# Patient Record
Sex: Female | Born: 1980 | Race: White | Hispanic: No | Marital: Single | Smoking: Never smoker
Health system: Southern US, Community
[De-identification: ages and names within clinical notes are randomized; demographics above are authoritative.]

## PROBLEM LIST (undated history)

## (undated) ENCOUNTER — Inpatient Hospital Stay (HOSPITAL_COMMUNITY): Payer: Self-pay

## (undated) DIAGNOSIS — Z9882 Breast implant status: Secondary | ICD-10-CM

## (undated) DIAGNOSIS — Z789 Other specified health status: Secondary | ICD-10-CM

## (undated) DIAGNOSIS — R87629 Unspecified abnormal cytological findings in specimens from vagina: Secondary | ICD-10-CM

## (undated) HISTORY — PX: BREAST SURGERY: SHX581

## (undated) HISTORY — PX: WISDOM TOOTH EXTRACTION: SHX21

## (undated) HISTORY — PX: NO PAST SURGERIES: SHX2092

---
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
## 2000-12-18 ENCOUNTER — Emergency Department (HOSPITAL_COMMUNITY): Admission: EM | Admit: 2000-12-18 | Discharge: 2000-12-19 | Payer: Self-pay | Admitting: Emergency Medicine

## 2000-12-19 ENCOUNTER — Encounter: Payer: Self-pay | Admitting: Emergency Medicine

## 2001-05-18 ENCOUNTER — Other Ambulatory Visit: Admission: RE | Admit: 2001-05-18 | Discharge: 2001-05-18 | Payer: Self-pay | Admitting: Obstetrics and Gynecology

## 2002-07-09 ENCOUNTER — Emergency Department (HOSPITAL_COMMUNITY): Admission: EM | Admit: 2002-07-09 | Discharge: 2002-07-09 | Payer: Self-pay | Admitting: Emergency Medicine

## 2002-07-10 ENCOUNTER — Emergency Department (HOSPITAL_COMMUNITY): Admission: EM | Admit: 2002-07-10 | Discharge: 2002-07-10 | Payer: Self-pay | Admitting: Emergency Medicine

## 2002-07-11 ENCOUNTER — Encounter: Payer: Self-pay | Admitting: Emergency Medicine

## 2003-04-28 HISTORY — PX: DILATION AND EVACUATION: SHX1459

## 2003-06-16 ENCOUNTER — Ambulatory Visit (HOSPITAL_COMMUNITY): Admission: RE | Admit: 2003-06-16 | Discharge: 2003-06-16 | Payer: Self-pay | Admitting: Emergency Medicine

## 2003-06-16 ENCOUNTER — Encounter (INDEPENDENT_AMBULATORY_CARE_PROVIDER_SITE_OTHER): Payer: Self-pay | Admitting: *Deleted

## 2003-10-19 ENCOUNTER — Other Ambulatory Visit: Admission: RE | Admit: 2003-10-19 | Discharge: 2003-10-19 | Payer: Self-pay | Admitting: Obstetrics and Gynecology

## 2004-12-26 ENCOUNTER — Other Ambulatory Visit: Admission: RE | Admit: 2004-12-26 | Discharge: 2004-12-26 | Payer: Self-pay | Admitting: Obstetrics and Gynecology

## 2006-03-07 ENCOUNTER — Encounter: Admission: RE | Admit: 2006-03-07 | Discharge: 2006-03-07 | Payer: Self-pay | Admitting: Neurological Surgery

## 2007-01-17 ENCOUNTER — Inpatient Hospital Stay (HOSPITAL_COMMUNITY): Admission: AD | Admit: 2007-01-17 | Discharge: 2007-01-18 | Payer: Self-pay | Admitting: Obstetrics and Gynecology

## 2007-01-18 ENCOUNTER — Inpatient Hospital Stay (HOSPITAL_COMMUNITY): Admission: AD | Admit: 2007-01-18 | Discharge: 2007-01-20 | Payer: Self-pay | Admitting: Obstetrics and Gynecology

## 2008-08-10 ENCOUNTER — Inpatient Hospital Stay (HOSPITAL_COMMUNITY): Admission: AD | Admit: 2008-08-10 | Discharge: 2008-08-12 | Payer: Self-pay | Admitting: Obstetrics and Gynecology

## 2010-05-18 ENCOUNTER — Encounter: Payer: Self-pay | Admitting: Neurological Surgery

## 2010-08-06 LAB — CBC
HCT: 36.3 % (ref 36.0–46.0)
HCT: 38.7 % (ref 36.0–46.0)
Hemoglobin: 12.9 g/dL (ref 12.0–15.0)
Hemoglobin: 13.6 g/dL (ref 12.0–15.0)
MCHC: 35.2 g/dL (ref 30.0–36.0)
MCV: 91.5 fL (ref 78.0–100.0)
MCV: 92.2 fL (ref 78.0–100.0)
RBC: 4.2 MIL/uL (ref 3.87–5.11)
RDW: 13.1 % (ref 11.5–15.5)
WBC: 9.9 10*3/uL (ref 4.0–10.5)

## 2010-08-06 LAB — RPR: RPR Ser Ql: NONREACTIVE

## 2010-08-26 ENCOUNTER — Other Ambulatory Visit: Payer: Self-pay | Admitting: Geriatric Medicine

## 2010-08-26 DIAGNOSIS — R0602 Shortness of breath: Secondary | ICD-10-CM

## 2010-08-28 ENCOUNTER — Other Ambulatory Visit: Payer: Self-pay

## 2010-08-29 ENCOUNTER — Ambulatory Visit
Admission: RE | Admit: 2010-08-29 | Discharge: 2010-08-29 | Disposition: A | Discharge status: Home / Self Care | Payer: Medicaid Other | Source: Ambulatory Visit | Attending: Geriatric Medicine | Admitting: Geriatric Medicine

## 2010-08-29 DIAGNOSIS — R0602 Shortness of breath: Secondary | ICD-10-CM

## 2010-08-29 MED ORDER — IOHEXOL 350 MG/ML SOLN
100.0000 mL | Freq: Once | INTRAVENOUS | Status: AC | PRN
  Administered 2010-08-29: 100 mL via INTRAVENOUS

## 2010-09-12 NOTE — Op Note (Signed)
NAME:  Carolyn Alexander, Carolyn Alexander                           ACCOUNT NO.:  [id number]   MEDICAL RECORD NO.:  [id number]                   PATIENT TYPE:  AMB   LOCATION:  SDC                                  FACILITY:  [facility]   PHYSICIAN:  Michelle L. Vincente Poli, M.D.            DATE OF BIRTH:  09-05-1980   DATE OF PROCEDURE:  06/16/2003  DATE OF DISCHARGE:                                 OPERATIVE REPORT   PREOPERATIVE DIAGNOSIS:  Retained products of conception.   POSTOPERATIVE DIAGNOSIS:  Retained products of conception.   PROCEDURE:  Dilatation and evacuation.   SURGEON:  Michelle L. Vincente Poli, M.D.   ANESTHESIA:  MAC.   INDICATIONS FOR PROCEDURE:  Retained products of conception.   DESCRIPTION OF PROCEDURE:  The patient was taken to the operating room,  where she was given anesthesia, placed in lithotomy position.  The vagina  and vulva were prepped and draped in the usual sterile fashion.  An in-and-  out catheter was used to empty the bladder.  The speculum was inserted into  the vagina and the cervix was grasped with a tenaculum and the uterus was  sounded to 7 cm in the midline.  The cervical internal os was gently dilated  with the [facility] dilators, initially started with a sharp curette, and we  retrieved a moderate amount of tissue consistent with positive.  I then  inserted the #7 suction cannula and did a suction curettage too.  At the end  of that I reinserted the sharp curette and all tissue was noted to be  removed and the uterus was completely cleaned.  There was no vaginal  bleeding noted.  All instruments were removed from the vagina.  All sponge,  lap, and needle counts were correct x2.  The patient tolerated the procedure  well and went to the recovery room in stable condition.                                               Michelle L. Vincente Poli, M.D.    Florestine Avers  D:  06/16/2003  T:  06/16/2003  Job:  [id number]

## 2011-02-05 LAB — CBC
HCT: 35.7 — ABNORMAL LOW
Hemoglobin: 12.6
MCHC: 35.1
MCHC: 35.5
MCV: 90.2
Platelets: 177
Platelets: 189
RDW: 12.1
WBC: 9.9

## 2011-04-28 DIAGNOSIS — Z9882 Breast implant status: Secondary | ICD-10-CM

## 2011-04-28 HISTORY — DX: Breast implant status: Z98.82

## 2011-04-28 NOTE — L&D Delivery Note (Signed)
Operative Delivery Note At 6:13 PM a viable female was delivered via Vaginal, [employer], operational).  Presentation: vertex; Position: Occiput,, Anterior; Station: +3. Pt extremely numb, noted to be straight OA @ + 3, mother  Exhausted, 2 traction efforts to delivery  Verbal consent: obtained from patient.  Risks and benefits discussed in detail.  Risks include, but are not limited to the risks of anesthesia, bleeding, infection, damage to maternal tissues, fetal cephalhematoma.  There is also the risk of inability to effect vaginal delivery of the head, or shoulder dystocia that cannot be resolved by established maneuvers, leading to the need for emergency cesarean section.  APGAR: 8, 9; weight .   Placenta status: Intact, Spontaneous.   Cord: 3 vessels with the following complications: None.  Cord pH: sent  Anesthesia: Epidural  Instruments: KIWI Episiotomy: None Lacerations: None Suture Repair: NA Est. Blood Loss (mL):   Mom to postpartum.  Baby to nursery-stable.  Meriel Pica 04/26/2012, 6:44 PM

## 2011-09-25 LAB — OB RESULTS CONSOLE GC/CHLAMYDIA
Chlamydia: NEGATIVE
Gonorrhea: NEGATIVE

## 2011-09-25 LAB — OB RESULTS CONSOLE ANTIBODY SCREEN: Antibody Screen: NEGATIVE

## 2011-09-25 LAB — OB RESULTS CONSOLE RPR: RPR: NONREACTIVE

## 2012-03-26 LAB — OB RESULTS CONSOLE GBS: GBS: NEGATIVE

## 2012-04-26 ENCOUNTER — Encounter (HOSPITAL_COMMUNITY): Payer: Self-pay | Admitting: Anesthesiology

## 2012-04-26 ENCOUNTER — Encounter (HOSPITAL_COMMUNITY): Payer: Self-pay | Admitting: *Deleted

## 2012-04-26 ENCOUNTER — Inpatient Hospital Stay (HOSPITAL_COMMUNITY)
Admission: AD | Admit: 2012-04-26 | Discharge: 2012-04-27 | DRG: 775 | Disposition: A | Discharge status: Home / Self Care | Payer: Medicaid Other | Source: Ambulatory Visit | Attending: Obstetrics and Gynecology | Admitting: Obstetrics and Gynecology

## 2012-04-26 ENCOUNTER — Inpatient Hospital Stay (HOSPITAL_COMMUNITY): Payer: Medicaid Other | Admitting: Anesthesiology

## 2012-04-26 DIAGNOSIS — O99892 Other specified diseases and conditions complicating childbirth: Secondary | ICD-10-CM | POA: Diagnosis not present

## 2012-04-26 DIAGNOSIS — R209 Unspecified disturbances of skin sensation: Secondary | ICD-10-CM | POA: Diagnosis not present

## 2012-04-26 HISTORY — DX: Other specified health status: Z78.9

## 2012-04-26 LAB — CBC
HCT: 38.8 % (ref 36.0–46.0)
Hemoglobin: 13.6 g/dL (ref 12.0–15.0)
MCV: 87 fL (ref 78.0–100.0)
RBC: 4.46 MIL/uL (ref 3.87–5.11)
WBC: 8.8 10*3/uL (ref 4.0–10.5)

## 2012-04-26 MED ORDER — LIDOCAINE HCL (PF) 1 % IJ SOLN
30.0000 mL | INTRAMUSCULAR | Status: DC | PRN
  Filled 2012-04-26: qty 30

## 2012-04-26 MED ORDER — OXYCODONE-ACETAMINOPHEN 5-325 MG PO TABS
1.0000 | ORAL_TABLET | Freq: Four times a day (QID) | ORAL | Status: DC | PRN
  Administered 2012-04-27 (×3): 1 via ORAL
  Filled 2012-04-26 (×3): qty 1

## 2012-04-26 MED ORDER — ACETAMINOPHEN 325 MG PO TABS: 650.0000 mg | ORAL_TABLET | ORAL | Status: DC | PRN

## 2012-04-26 MED ORDER — DEXTROSE IN LACTATED RINGERS 5 % IV SOLN: INTRAVENOUS | Status: DC

## 2012-04-26 MED ORDER — FENTANYL 2.5 MCG/ML BUPIVACAINE 1/10 % EPIDURAL INFUSION (WH - ANES)
INTRAMUSCULAR | Status: DC | PRN
  Administered 2012-04-26: 14 mL/h via EPIDURAL

## 2012-04-26 MED ORDER — OXYTOCIN 40 UNITS IN LACTATED RINGERS INFUSION - SIMPLE MED
62.5000 mL/h | INTRAVENOUS | Status: DC
  Administered 2012-04-26: 62.5 mL/h via INTRAVENOUS
  Filled 2012-04-26: qty 1000

## 2012-04-26 MED ORDER — FENTANYL 2.5 MCG/ML BUPIVACAINE 1/10 % EPIDURAL INFUSION (WH - ANES)
14.0000 mL/h | INTRAMUSCULAR | Status: DC
  Filled 2012-04-26: qty 125

## 2012-04-26 MED ORDER — LACTATED RINGERS IV SOLN: 500.0000 mL | INTRAVENOUS | Status: DC | PRN

## 2012-04-26 MED ORDER — PRENATAL MULTIVITAMIN CH
1.0000 | ORAL_TABLET | Freq: Every day | ORAL | Status: DC
  Administered 2012-04-27: 1 via ORAL
  Filled 2012-04-26: qty 1

## 2012-04-26 MED ORDER — ONDANSETRON HCL 4 MG/2ML IJ SOLN: 4.0000 mg | Freq: Four times a day (QID) | INTRAMUSCULAR | Status: DC | PRN

## 2012-04-26 MED ORDER — MEASLES, MUMPS & RUBELLA VAC ~~LOC~~ INJ: 0.5000 mL | INJECTION | Freq: Once | SUBCUTANEOUS | Status: DC

## 2012-04-26 MED ORDER — TETANUS-DIPHTH-ACELL PERTUSSIS 5-2.5-18.5 LF-MCG/0.5 IM SUSP: 0.5000 mL | Freq: Once | INTRAMUSCULAR | Status: DC

## 2012-04-26 MED ORDER — OXYTOCIN BOLUS FROM INFUSION: 500.0000 mL | INTRAVENOUS | Status: DC

## 2012-04-26 MED ORDER — EPHEDRINE 5 MG/ML INJ
10.0000 mg | INTRAVENOUS | Status: DC | PRN
  Filled 2012-04-26: qty 4

## 2012-04-26 MED ORDER — BISACODYL 10 MG RE SUPP: 10.0000 mg | Freq: Every day | RECTAL | Status: DC | PRN

## 2012-04-26 MED ORDER — ONDANSETRON HCL 4 MG PO TABS: 4.0000 mg | ORAL_TABLET | ORAL | Status: DC | PRN

## 2012-04-26 MED ORDER — WITCH HAZEL-GLYCERIN EX PADS: 1.0000 "application " | MEDICATED_PAD | CUTANEOUS | Status: DC | PRN

## 2012-04-26 MED ORDER — LACTATED RINGERS IV SOLN
500.0000 mL | Freq: Once | INTRAVENOUS | Status: AC
  Administered 2012-04-26: 500 mL via INTRAVENOUS

## 2012-04-26 MED ORDER — LANOLIN HYDROUS EX OINT: TOPICAL_OINTMENT | CUTANEOUS | Status: DC | PRN

## 2012-04-26 MED ORDER — SIMETHICONE 80 MG PO CHEW: 80.0000 mg | CHEWABLE_TABLET | ORAL | Status: DC | PRN

## 2012-04-26 MED ORDER — DIPHENHYDRAMINE HCL 25 MG PO CAPS: 25.0000 mg | ORAL_CAPSULE | Freq: Four times a day (QID) | ORAL | Status: DC | PRN

## 2012-04-26 MED ORDER — EPHEDRINE 5 MG/ML INJ: 10.0000 mg | INTRAVENOUS | Status: DC | PRN

## 2012-04-26 MED ORDER — LIDOCAINE HCL (PF) 1 % IJ SOLN
INTRAMUSCULAR | Status: DC | PRN
  Administered 2012-04-26: 4 mL
  Administered 2012-04-26: 5 mL

## 2012-04-26 MED ORDER — FLEET ENEMA 7-19 GM/118ML RE ENEM: 1.0000 | ENEMA | Freq: Every day | RECTAL | Status: DC | PRN

## 2012-04-26 MED ORDER — SENNOSIDES-DOCUSATE SODIUM 8.6-50 MG PO TABS: 2.0000 | ORAL_TABLET | Freq: Every day | ORAL | Status: DC

## 2012-04-26 MED ORDER — SODIUM BICARBONATE 8.4 % IV SOLN
INTRAVENOUS | Status: DC | PRN
  Administered 2012-04-26: 4 mL via EPIDURAL

## 2012-04-26 MED ORDER — ONDANSETRON HCL 4 MG/2ML IJ SOLN: 4.0000 mg | INTRAMUSCULAR | Status: DC | PRN

## 2012-04-26 MED ORDER — PHENYLEPHRINE 40 MCG/ML (10ML) SYRINGE FOR IV PUSH (FOR BLOOD PRESSURE SUPPORT)
80.0000 ug | PREFILLED_SYRINGE | INTRAVENOUS | Status: DC | PRN
  Filled 2012-04-26: qty 5

## 2012-04-26 MED ORDER — OXYCODONE-ACETAMINOPHEN 5-325 MG PO TABS: 1.0000 | ORAL_TABLET | ORAL | Status: DC | PRN

## 2012-04-26 MED ORDER — PHENYLEPHRINE 40 MCG/ML (10ML) SYRINGE FOR IV PUSH (FOR BLOOD PRESSURE SUPPORT): 80.0000 ug | PREFILLED_SYRINGE | INTRAVENOUS | Status: DC | PRN

## 2012-04-26 MED ORDER — DIPHENHYDRAMINE HCL 50 MG/ML IJ SOLN: 12.5000 mg | INTRAMUSCULAR | Status: DC | PRN

## 2012-04-26 MED ORDER — IBUPROFEN 600 MG PO TABS: 600.0000 mg | ORAL_TABLET | Freq: Four times a day (QID) | ORAL | Status: DC | PRN

## 2012-04-26 MED ORDER — ZOLPIDEM TARTRATE 5 MG PO TABS: 5.0000 mg | ORAL_TABLET | Freq: Every evening | ORAL | Status: DC | PRN

## 2012-04-26 MED ORDER — BENZOCAINE-MENTHOL 20-0.5 % EX AERO
1.0000 "application " | INHALATION_SPRAY | CUTANEOUS | Status: DC | PRN
  Administered 2012-04-26: 1 via TOPICAL
  Filled 2012-04-26: qty 56

## 2012-04-26 MED ORDER — LACTATED RINGERS IV SOLN
INTRAVENOUS | Status: DC
  Administered 2012-04-26: 125 mL/h via INTRAVENOUS

## 2012-04-26 MED ORDER — DIBUCAINE 1 % RE OINT: 1.0000 "application " | TOPICAL_OINTMENT | RECTAL | Status: DC | PRN

## 2012-04-26 MED ORDER — IBUPROFEN 800 MG PO TABS
800.0000 mg | ORAL_TABLET | Freq: Three times a day (TID) | ORAL | Status: DC | PRN
  Administered 2012-04-26 – 2012-04-27 (×3): 800 mg via ORAL
  Filled 2012-04-26 (×4): qty 1

## 2012-04-26 MED ORDER — CITRIC ACID-SODIUM CITRATE 334-500 MG/5ML PO SOLN: 30.0000 mL | ORAL | Status: DC | PRN

## 2012-04-26 NOTE — H&P (Signed)
Carolyn Alexander  DICTATION # [id number] CSN# [id number]   Meriel Pica, MD 04/26/2012 12:01 PM

## 2012-04-26 NOTE — Anesthesia Procedure Notes (Addendum)
Epidural Patient location during procedure: OB Start time: 04/26/2012 3:04 PM  Staffing Anesthesiologist: Amear Strojny A. Performed by: anesthesiologist   Preanesthetic Checklist Completed: patient identified, site marked, surgical consent, pre-op evaluation, timeout performed, IV checked, risks and benefits discussed and monitors and equipment checked  Epidural Patient position: sitting Prep: site prepped and draped and DuraPrep Patient monitoring: continuous pulse ox and blood pressure Approach: midline Injection technique: LOR air  Needle:  Needle type: Tuohy  Needle gauge: 17 G Needle length: 9 cm and 9 Needle insertion depth: 4 cm Catheter type: closed end flexible Catheter size: 19 Gauge Catheter at skin depth: 9 cm Test dose: negative and Other  Assessment Events: blood not aspirated, injection not painful, no injection resistance, negative IV test and no paresthesia  Additional Notes Patient identified. Risks and benefits discussed including failed block, incomplete  Pain control, post dural puncture headache, nerve damage, paralysis, blood pressure Changes, nausea, vomiting, reactions to medications-both toxic and allergic and post Partum back pain. All questions were answered. Patient expressed understanding and wished to proceed. Sterile technique was used throughout procedure. Epidural site was Dressed with sterile barrier dressing. No paresthesias, signs of intravascular injection Or signs of intrathecal spread were encountered.  Patient was more comfortable after the epidural was dosed. Please see RN's note for documentation of vital signs and FHR which are stable.   Epidural Patient location during procedure: OB Start time: 04/26/2012 3:56 PM  Staffing Anesthesiologist: Zaryiah Barz A. Performed by: anesthesiologist   Preanesthetic Checklist Completed: patient identified, site marked, surgical consent, pre-op evaluation, timeout performed, IV  checked, risks and benefits discussed and monitors and equipment checked  Epidural Patient position: sitting Prep: site prepped and draped and DuraPrep Patient monitoring: continuous pulse ox and blood pressure Approach: midline Injection technique: LOR air  Needle:  Needle type: Tuohy  Needle gauge: 17 G Needle length: 9 cm and 9 Needle insertion depth: 4 cm Catheter type: closed end flexible Catheter size: 19 Gauge Catheter at skin depth: 9 cm Test dose: negative and Other  Assessment Events: blood not aspirated, injection not painful, no injection resistance, negative IV test and no paresthesia  Additional Notes Patient identified. Risks and benefits discussed including failed block, incomplete  Pain control, post dural puncture headache, nerve damage, paralysis, blood pressure Changes, nausea, vomiting, reactions to medications-both toxic and allergic and post Partum back pain. All questions were answered. Patient expressed understanding and wished to proceed. Sterile technique was used throughout procedure. Epidural site was Dressed with sterile barrier dressing. No paresthesias, signs of intravascular injection Or signs of intrathecal spread were encountered.  Patient was more comfortable after the epidural was dosed. Please see RN's note for documentation of vital signs and FHR which are stable.

## 2012-04-26 NOTE — Progress Notes (Signed)
AROM>>clear AF, cx 3/50/vtx/-2, requests epidural

## 2012-04-26 NOTE — Progress Notes (Signed)
Now C/C/zero, very numb, will start pushing soon

## 2012-04-26 NOTE — Anesthesia Preprocedure Evaluation (Signed)

## 2012-04-27 LAB — CBC
Hemoglobin: 12.1 g/dL (ref 12.0–15.0)
RBC: 3.9 MIL/uL (ref 3.87–5.11)
WBC: 10.1 10*3/uL (ref 4.0–10.5)

## 2012-04-27 MED ORDER — IBUPROFEN 800 MG PO TABS: 800.0000 mg | ORAL_TABLET | Freq: Three times a day (TID) | ORAL | Status: DC | PRN

## 2012-04-27 MED ORDER — OXYCODONE-ACETAMINOPHEN 5-325 MG PO TABS: 1.0000 | ORAL_TABLET | Freq: Four times a day (QID) | ORAL | Status: DC | PRN

## 2012-04-27 NOTE — Progress Notes (Signed)
Post Partum Day 1 Subjective: no complaints  Objective: Blood pressure 106/68, pulse 78, temperature 98 F (36.7 C), temperature source Oral, resp. rate 20, height 5\' 7"  (1.702 m), weight 146 lb (66.225 kg), last menstrual period 07/23/2011, SpO2 99.00%, unknown if currently breastfeeding.  Physical Exam:  General: alert Lochia: appropriate Uterine Fundus: firm Incision: healing well DVT Evaluation: No evidence of DVT seen on physical exam.   Basename 04/27/12 0540 04/26/12 1125  HGB 12.1 13.6  HCT 34.3* 38.8    Assessment/Plan: Discharge home   LOS: 1 day   Kacyn Souder M 04/27/2012, 12:00 PM

## 2012-04-27 NOTE — Anesthesia Postprocedure Evaluation (Signed)
Anesthesia Post Note  Patient: Carolyn Alexander  Procedure(s) Performed: * No procedures listed *  Anesthesia type: Epidural  Patient location: Mother/Baby  Post pain: Pain level controlled  Post assessment: Post-op Vital signs reviewed  Last Vitals:  Filed Vitals:   04/27/12 0540  BP: 106/68  Pulse: 78  Temp: 36.7 C  Resp: 20    Post vital signs: Reviewed  Level of consciousness:alert  Complications: No apparent anesthesia complications

## 2012-04-27 NOTE — Discharge Summary (Signed)
Obstetric Discharge Summary Reason for Admission: onset of labor Prenatal Procedures: none Intrapartum Procedures: spontaneous vaginal delivery, VE assist Postpartum Procedures: none Complications-Operative and Postpartum: none Hemoglobin  Date Value Range Status  04/27/2012 12.1  12.0 - 15.0 g/dL Final     HCT  Date Value Range Status  04/27/2012 34.3* 36.0 - 46.0 % Final    Physical Exam:  General: alert Lochia: appropriate Uterine Fundus: firm Incision: healing well DVT Evaluation: No evidence of DVT seen on physical exam.  Discharge Diagnoses: Term Pregnancy-delivered  Discharge Information: Date: 04/27/2012 Activity: pelvic rest Diet: routine Medications: PNV, Ibuprofen and Percocet Condition: stable Instructions: refer to practice specific booklet Discharge to: home Follow-up Information    Follow up with Carolyn Pica, MD. Schedule an appointment as soon as possible for a visit in 6 weeks.   Contact information:   [address] [place] [address] [phone]          Newborn Data: Live born female  Birth Weight: 7 lb 9.9 oz (3455 g) APGAR: 8, 9  Home with mother.  Carolyn Alexander 04/27/2012, 12:01 PM

## 2012-04-28 NOTE — H&P (Signed)
NAMEBENA, KOBEL               ACCOUNT NO.:  [id number]  MEDICAL RECORD NO.:  [id number]  LOCATION:                                 FACILITY:  PHYSICIAN:  Duke Salvia. Marcelle Overlie, M.D.DATE OF BIRTH:  1981-02-19  DATE OF ADMISSION: DATE OF DISCHARGE:                             HISTORY & PHYSICAL   CHIEF COMPLAINT:  Labor at term.  HISTORY OF PRESENT ILLNESS:  A 32 year old, G6, P 2-0-3-2, EDD April 28, 2012, presents to the office at 39-5/7th weeks, 3 cm 75% in early labor, was sent to labor and delivery for admission.  GBS is negative. A 1-hour GTT was normal.  PAST MEDICAL HISTORY:  Please see the [facility] form for all details.  PHYSICAL EXAMINATION:  VITAL SIGNS:  Temp 98.2, blood pressure 120/72. HEENT:  Unremarkable. NECK:  Supple without masses.  LUNGS:  Clear.  CARDIOVASCULAR:  Regular rate and rhythm without murmurs, rubs or gallops noted.  BREASTS:  Not examined. PELVIC:  Term fundal height.  Fetal heart rate 140, cervix is 3, 75% vertex, -2. EXTREMITIES:  Unremarkable. NEUROLOGIC:  Unremarkable.  IMPRESSION:  Term pregnancy, early labor.  PLAN:  We will admit for labor, request  epidural.     Duke Salvia. Marcelle Overlie, M.D.     [facility]  D:  04/26/2012  T:  04/26/2012  Job:  [id number]

## 2012-04-28 NOTE — Progress Notes (Signed)
Post discharge chart review completed.  

## 2012-10-05 ENCOUNTER — Other Ambulatory Visit: Payer: Self-pay

## 2013-02-17 IMAGING — CT CT ANGIO CHEST
3 of 6 series · 11 of 30 positions shown · IV contrast ([ID] OMNI 300)
Comparison: None.

CLINICAL DATA: Shortness of breath for 3.5 weeks, nonsmoker 8
years.

CT ANGIOGRAPHY CHEST WITH CONTRAST
TECHNIQUE: Multidetector CT imaging of the chest was performed
using the standard protocol during bolus administration of
intravenous contrast.  Multiplanar CT image reconstructions
including MIPs were obtained to evaluate the vascular anatomy.
Contrast:  Intravenous 100 ml Amnipaque-300.

[Series 4: pe 1.25 · axial · 0.60mm/px · z∈[-207,+0]mm · 6 of 234 slices shown]
[im 34/234  lung]
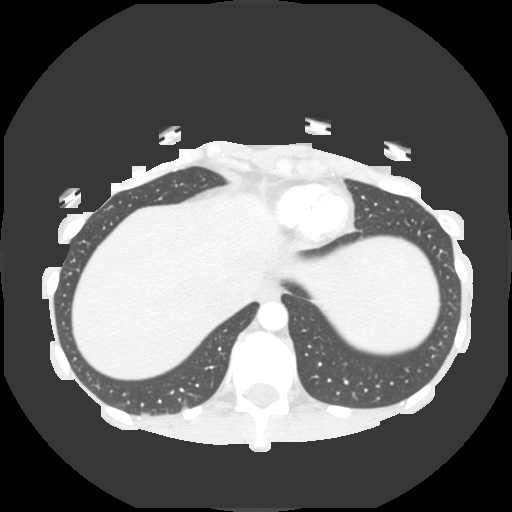
[im 67/234  mediastinal]
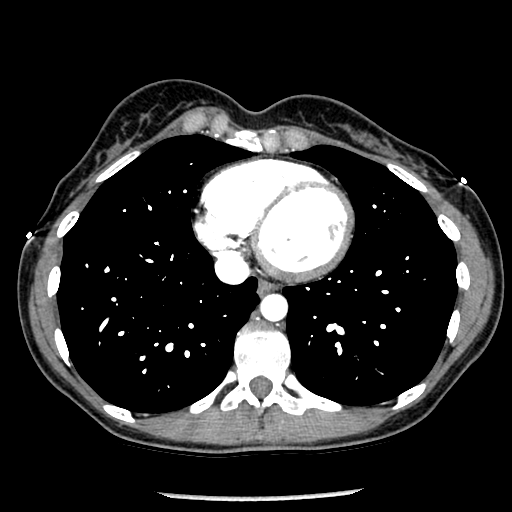
[im 100/234  lung]
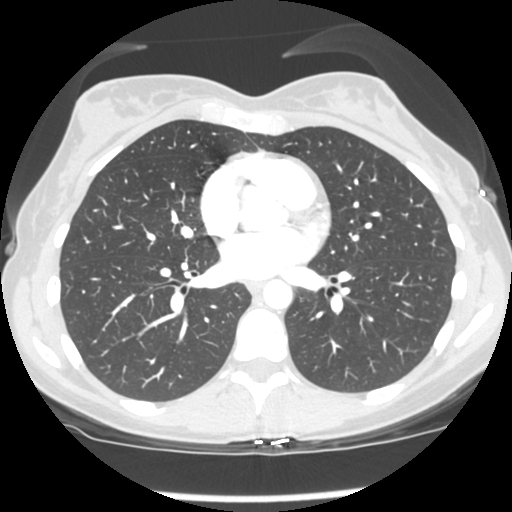
[im 134/234  mediastinal]
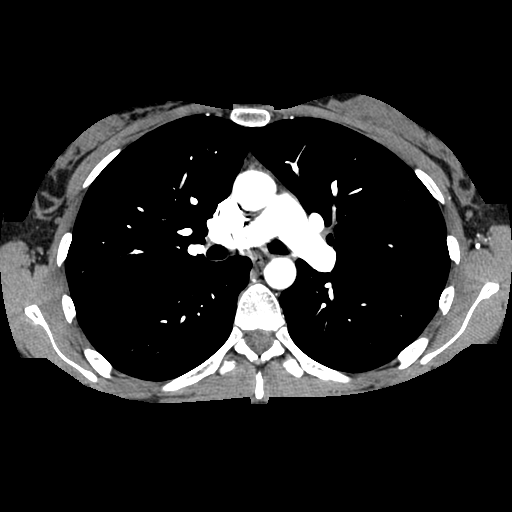
[im 167/234  lung]
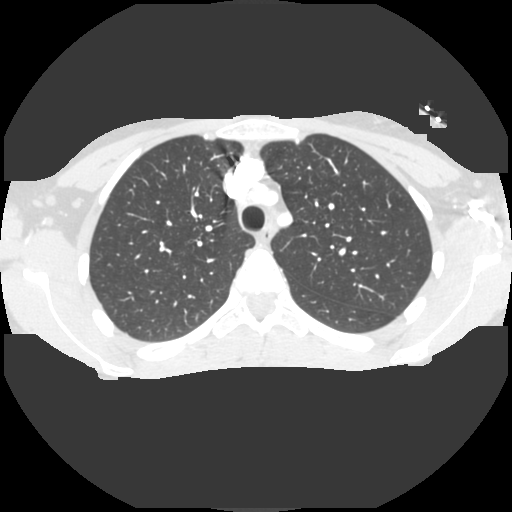
[im 200/234  mediastinal]
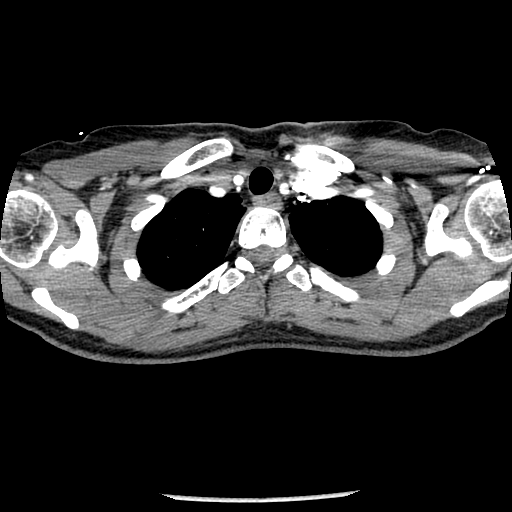

[Series 5: pe 2.5 · axial · 0.60mm/px · z∈[-152,-54]mm · 2 of 117 slices shown]
[im 39/117  lung]
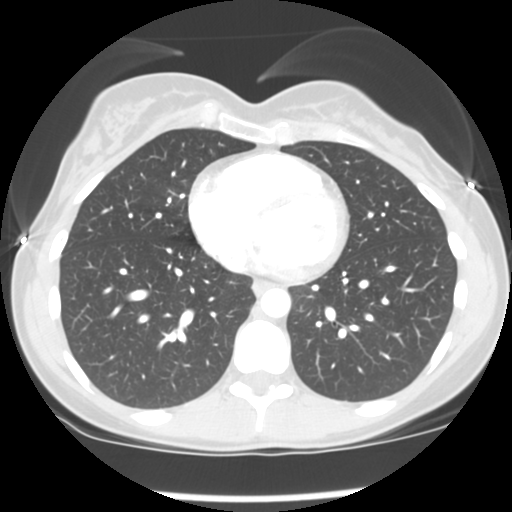
[im 78/117  lung]
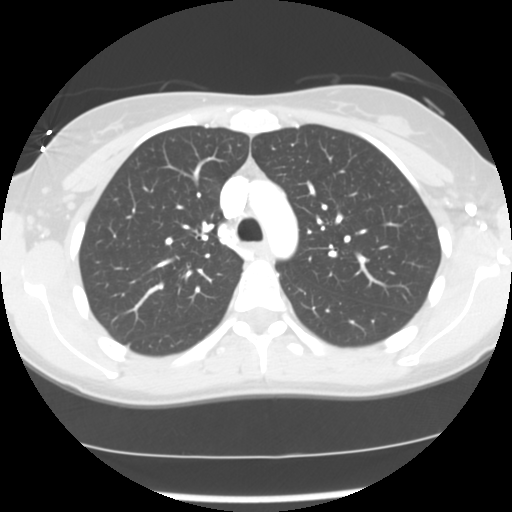

[Series 602: sagittal body · sagittal · 0.60mm/px · 3 of 124 slices shown]
[im 31/124  lung]
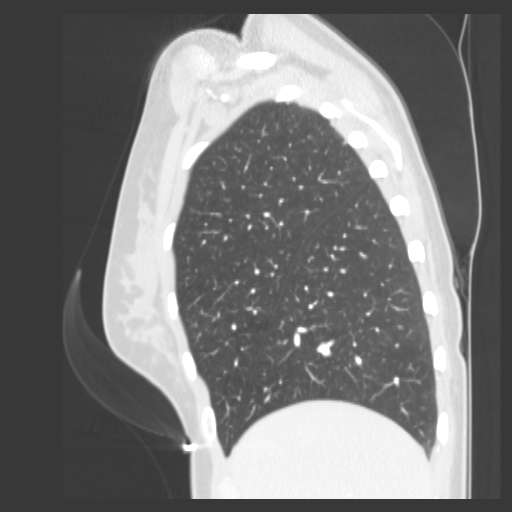
[im 62/124  lung]
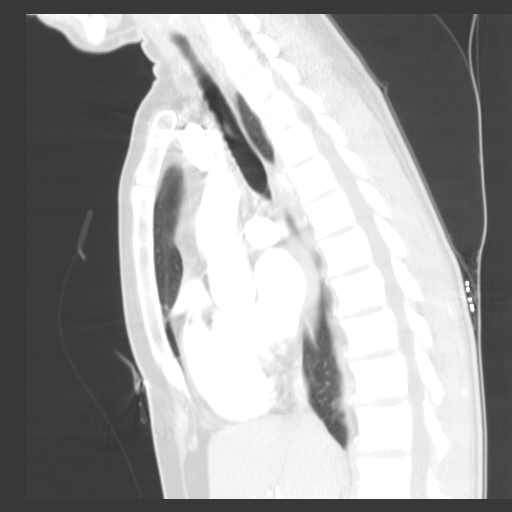
[im 93/124  lung]
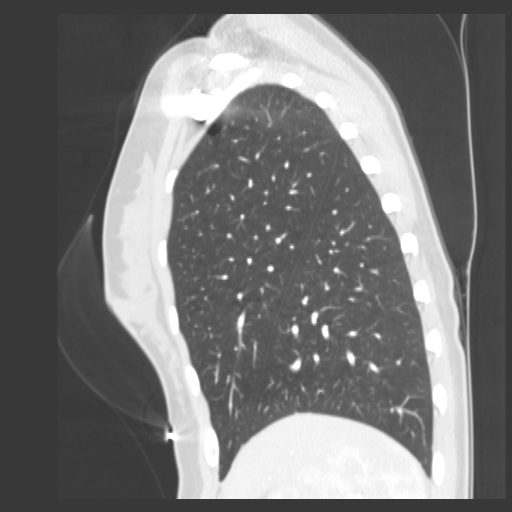

[11 of 30 positions shown; findings below may reference images not displayed]

FINDINGS: No evidence for acute pulmonary embolus.

Lungs are clear.

No mediastinal, hilar, axillary, supraclavicular mass/adenopathy
seen.

Osseous structures appear normal.

Heart size appears normal.

Review of the MIP images confirms the above findings.
IMPRESSION: Normal.

## 2013-03-30 LAB — OB RESULTS CONSOLE HEPATITIS B SURFACE ANTIGEN: HEP B S AG: NEGATIVE

## 2013-03-30 LAB — OB RESULTS CONSOLE RPR: RPR: NONREACTIVE

## 2013-03-30 LAB — OB RESULTS CONSOLE ABO/RH: RH Type: POSITIVE

## 2013-03-30 LAB — OB RESULTS CONSOLE ANTIBODY SCREEN: Antibody Screen: NEGATIVE

## 2013-03-30 LAB — OB RESULTS CONSOLE HIV ANTIBODY (ROUTINE TESTING): HIV: NONREACTIVE

## 2013-03-30 LAB — OB RESULTS CONSOLE RUBELLA ANTIBODY, IGM: RUBELLA: IMMUNE

## 2013-05-20 ENCOUNTER — Encounter (HOSPITAL_COMMUNITY): Payer: Self-pay | Admitting: *Deleted

## 2013-05-20 ENCOUNTER — Inpatient Hospital Stay (HOSPITAL_COMMUNITY)
Admission: AD | Admit: 2013-05-20 | Discharge: 2013-05-21 | Disposition: A | Discharge status: Home / Self Care | Payer: Medicaid Other | Source: Ambulatory Visit | Attending: Obstetrics and Gynecology | Admitting: Obstetrics and Gynecology

## 2013-05-20 DIAGNOSIS — W108XXA Fall (on) (from) other stairs and steps, initial encounter: Secondary | ICD-10-CM | POA: Insufficient documentation

## 2013-05-20 DIAGNOSIS — Y92009 Unspecified place in unspecified non-institutional (private) residence as the place of occurrence of the external cause: Secondary | ICD-10-CM | POA: Insufficient documentation

## 2013-05-20 DIAGNOSIS — O9A212 Injury, poisoning and certain other consequences of external causes complicating pregnancy, second trimester: Secondary | ICD-10-CM

## 2013-05-20 DIAGNOSIS — O30009 Twin pregnancy, unspecified number of placenta and unspecified number of amniotic sacs, unspecified trimester: Secondary | ICD-10-CM | POA: Insufficient documentation

## 2013-05-20 DIAGNOSIS — M545 Low back pain, unspecified: Secondary | ICD-10-CM | POA: Insufficient documentation

## 2013-05-20 DIAGNOSIS — O99891 Other specified diseases and conditions complicating pregnancy: Secondary | ICD-10-CM | POA: Insufficient documentation

## 2013-05-20 DIAGNOSIS — O9989 Other specified diseases and conditions complicating pregnancy, childbirth and the puerperium: Principal | ICD-10-CM

## 2013-05-20 DIAGNOSIS — O30039 Twin pregnancy, monochorionic/diamniotic, unspecified trimester: Secondary | ICD-10-CM | POA: Insufficient documentation

## 2013-05-20 HISTORY — DX: Breast implant status: Z98.82

## 2013-05-20 HISTORY — DX: Unspecified abnormal cytological findings in specimens from vagina: R87.629

## 2013-05-20 NOTE — MAU Provider Note (Signed)
History     CSN: [id number]  Arrival date and time: 05/20/13 2309   None     Chief Complaint  Patient presents with  . Fall   HPI  Pt is a 33 yo G7P3033 at [redacted]w[redacted]d wks pregnancy with mono/di twins (per prenatal records).  Reports falling down this evening around 2000 on her bottom and went down stairs hitting each stair (approximately 10) while carrying [age] year old.  Reports noticing clear liquid on underwear after the fall.  Denies vaginal bleeding or abdominal pain.  Patient reports pain in lower back and left arm after fall.  Declines xray, feels like it "is bruised".     Past Medical History  Diagnosis Date  . No pertinent past medical history     Past Surgical History  Procedure Laterality Date  . No past surgeries      No family history on file.  History  Substance Use Topics  . Smoking status: Not on file  . Smokeless tobacco: Not on file  . Alcohol Use: No    Allergies:  Allergies  Allergen Reactions  . Latex Rash    Prescriptions prior to admission  Medication Sig Dispense Refill  . ibuprofen (ADVIL,MOTRIN) 800 MG tablet Take 1 tablet (800 mg total) by mouth every 8 (eight) hours as needed.  30 tablet  2  . oxyCODONE-acetaminophen (PERCOCET/ROXICET) 5-325 MG per tablet Take 1-2 tablets by mouth every 6 (six) hours as needed.  30 tablet  0    Review of Systems  Genitourinary:       Leaking of fluid  Musculoskeletal: Positive for back pain (lower ).       Left arm pain  Neurological: Negative for tingling, sensory change, focal weakness and loss of consciousness.  All other systems reviewed and are negative.   Physical Exam   Blood pressure 94/64, pulse 97, temperature 98.4 F (36.9 C), resp. rate 20, height 5\' 7"  (1.702 m), weight 59.33 kg (130 lb 12.8 oz).  Physical Exam  Constitutional: She is oriented to person, place, and time. She appears well-developed and well-nourished. No distress.  HENT:  Head: Normocephalic.  Neck: Normal range of  motion. Neck supple.  Cardiovascular: Normal rate, regular rhythm and normal heart sounds.   Respiratory: Effort normal and breath sounds normal.  GI: Soft. There is no tenderness.  Genitourinary: No bleeding around the vagina. Vaginal discharge (creamy white discharge, negative pooling) found.  Musculoskeletal: Normal range of motion. She exhibits tenderness.       Lumbar back: She exhibits tenderness. She exhibits normal range of motion, no edema, no deformity and no laceration.       Back:       Arms: Neurological: She is alert and oriented to person, place, and time.  Skin: Skin is warm and dry.    MAU Course  Procedures 2355 Consulted with Dr. Rana Snare > reviewed HPI/exam>obtain ultrasound and give flexeril and pain meds  Results for orders placed during the hospital encounter of 05/20/13 (from the past 24 hour(s))  POCT FERN TEST     Status: None   Collection Time    05/21/13 12:01 AM      Result Value Range   POCT Fern Test Negative = intact amniotic membranes     Ultrasound: Mono-di twins Twin A FHR 150, subjectively normal amniotic fluid Twin B FHR 152, subjectively normal amniotic fluid Assessment and Plan  33 yo [id number] at [redacted]w[redacted]d wks IUP Traumatic Injury in Pregnancy  Plan: Discharge to home  Provided reassurance Pregnancy precautions Tylenol prn pain with follow-up for no improvement or worsening of arm or back pain.  [facility] HospitalMUHAMMAD,WALIDAH 05/20/2013, 11:37 PM

## 2013-05-20 NOTE — MAU Note (Signed)
I was carrying my [age] yr old and stepped down and slipped. Went down about 10 steps on my bottom and back. When I got up I was wet between my legs. No leaking since. No abd pain. Buttocks, back and Left arm sore

## 2013-05-21 ENCOUNTER — Inpatient Hospital Stay (HOSPITAL_COMMUNITY): Payer: Medicaid Other

## 2013-05-21 DIAGNOSIS — O30009 Twin pregnancy, unspecified number of placenta and unspecified number of amniotic sacs, unspecified trimester: Secondary | ICD-10-CM

## 2013-05-21 LAB — POCT FERN TEST: POCT Fern Test: NEGATIVE

## 2013-05-21 MED ORDER — ACETAMINOPHEN 500 MG PO TABS
500.0000 mg | ORAL_TABLET | Freq: Once | ORAL | Status: AC
  Administered 2013-05-21: 500 mg via ORAL
  Filled 2013-05-21: qty 1

## 2013-06-12 ENCOUNTER — Inpatient Hospital Stay (HOSPITAL_COMMUNITY)
Admission: AD | Admit: 2013-06-12 | Discharge: 2013-06-12 | Disposition: A | Discharge status: Home / Self Care | Payer: Medicaid Other | Source: Ambulatory Visit | Attending: Obstetrics & Gynecology | Admitting: Obstetrics & Gynecology

## 2013-06-12 ENCOUNTER — Inpatient Hospital Stay (HOSPITAL_COMMUNITY): Payer: Medicaid Other

## 2013-06-12 ENCOUNTER — Encounter (HOSPITAL_COMMUNITY): Payer: Self-pay

## 2013-06-12 DIAGNOSIS — O30009 Twin pregnancy, unspecified number of placenta and unspecified number of amniotic sacs, unspecified trimester: Secondary | ICD-10-CM | POA: Insufficient documentation

## 2013-06-12 DIAGNOSIS — N1330 Unspecified hydronephrosis: Secondary | ICD-10-CM

## 2013-06-12 DIAGNOSIS — R109 Unspecified abdominal pain: Secondary | ICD-10-CM | POA: Insufficient documentation

## 2013-06-12 DIAGNOSIS — O9989 Other specified diseases and conditions complicating pregnancy, childbirth and the puerperium: Principal | ICD-10-CM

## 2013-06-12 DIAGNOSIS — M62830 Muscle spasm of back: Secondary | ICD-10-CM

## 2013-06-12 DIAGNOSIS — O99891 Other specified diseases and conditions complicating pregnancy: Secondary | ICD-10-CM | POA: Insufficient documentation

## 2013-06-12 LAB — URINALYSIS, ROUTINE W REFLEX MICROSCOPIC
BILIRUBIN URINE: NEGATIVE
Glucose, UA: NEGATIVE mg/dL
HGB URINE DIPSTICK: NEGATIVE
Ketones, ur: NEGATIVE mg/dL
Leukocytes, UA: NEGATIVE
Nitrite: NEGATIVE
PH: 7.5 (ref 5.0–8.0)
Protein, ur: NEGATIVE mg/dL
SPECIFIC GRAVITY, URINE: 1.01 (ref 1.005–1.030)
Urobilinogen, UA: 0.2 mg/dL (ref 0.0–1.0)

## 2013-06-12 MED ORDER — CYCLOBENZAPRINE HCL 10 MG PO TABS: 10.0000 mg | ORAL_TABLET | Freq: Three times a day (TID) | ORAL | Status: DC | PRN

## 2013-06-12 MED ORDER — OXYCODONE-ACETAMINOPHEN 5-325 MG PO TABS: 1.0000 | ORAL_TABLET | Freq: Four times a day (QID) | ORAL | Status: DC | PRN

## 2013-06-12 MED ORDER — CYCLOBENZAPRINE HCL 10 MG PO TABS
10.0000 mg | ORAL_TABLET | Freq: Once | ORAL | Status: DC
  Filled 2013-06-12: qty 1

## 2013-06-12 NOTE — MAU Note (Signed)
Pt sent from office. C/o right flank pain since this morning. Denies urinary symptoms, fever. Did have some nausea earlier this morning, but denies vomiting. Denies hx of kidney stones.

## 2013-06-12 NOTE — Discharge Instructions (Signed)
Back Pain in Pregnancy °Back pain during pregnancy is common. It happens in about half of all pregnancies. It is important for you and your baby that you remain active during your pregnancy. If you feel that back pain is not allowing you to remain active or sleep well, it is time to see your caregiver. Back pain may be caused by several factors related to changes during your pregnancy. Fortunately, unless you had trouble with your back before your pregnancy, the pain is likely to get better after you deliver. °Low back pain usually occurs between the fifth and seventh months of pregnancy. It can, however, happen in the first couple months. Factors that increase the risk of back problems include:  °· Previous back problems. °· Injury to your back. °· Having twins or multiple births. °· A chronic cough. °· Stress. °· Job-related repetitive motions. °· Muscle or spinal disease in the back. °· Family history of back problems, ruptured (herniated) discs, or osteoporosis. °· Depression, anxiety, and panic attacks. °CAUSES  °· When you are pregnant, your body produces a hormone called relaxin. This hormone makes the ligaments connecting the low back and pubic bones more flexible. This flexibility allows the baby to be delivered more easily. When your ligaments are loose, your muscles need to work harder to support your back. Soreness in your back can come from tired muscles. Soreness can also come from back tissues that are irritated since they are receiving less support. °· As the baby grows, it puts pressure on the nerves and blood vessels in your pelvis. This can cause back pain. °· As the baby grows and gets heavier during pregnancy, the uterus pushes the stomach muscles forward and changes your center of gravity. This makes your back muscles work harder to maintain good posture. °SYMPTOMS  °Lumbar pain during pregnancy °Lumbar pain during pregnancy usually occurs at or above the waist in the center of the back. There  may be pain and numbness that radiates into your leg or foot. This is similar to low back pain experienced by non-pregnant women. It usually increases with sitting for long periods of time, standing, or repetitive lifting. Tenderness may also be present in the muscles along your upper back. °Posterior pelvic pain during pregnancy °Pain in the back of the pelvis is more common than lumbar pain in pregnancy. It is a deep pain felt in your side at the waistline, or across the tailbone (sacrum), or in both places. You may have pain on one or both sides. This pain can also go into the buttocks and backs of the upper thighs. Pubic and groin pain may also be present. The pain does not quickly resolve with rest, and morning stiffness may also be present. °Pelvic pain during pregnancy can be brought on by most activities. A high level of fitness before and during pregnancy may or may not prevent this problem. Labor pain is usually 1 to 2 minutes apart, lasts for about 1 minute, and involves a bearing down feeling or pressure in your pelvis. However, if you are at term with the pregnancy, constant low back pain can be the beginning of early labor, and you should be aware of this. °DIAGNOSIS  °X-rays of the back should not be done during the first 12 to 14 weeks of the pregnancy and only when absolutely necessary during the rest of the pregnancy. MRIs do not give off radiation and are safe during pregnancy. MRIs also should only be done when absolutely necessary. °HOME CARE INSTRUCTIONS °· Exercise   as directed by your caregiver. Exercise is the most effective way to prevent or manage back pain. If you have a back problem, it is especially important to avoid sports that require sudden body movements. Swimming and walking are great activities. °· Do not stand in one place for long periods of time. °· Do not wear high heels. °· Sit in chairs with good posture. Use a pillow on your lower back if necessary. Make sure your head  rests over your shoulders and is not hanging forward. °· Try sleeping on your side, preferably the left side, with a pillow or two between your legs. If you are sore after a night's rest, your bed may be too soft. Try placing a board between your mattress and box spring. °· Listen to your body when lifting. If you are experiencing pain, ask for help or try bending your knees more so you can use your leg muscles rather than your back muscles. Squat down when picking up something from the floor. Do not bend over. °· Eat a healthy diet. Try to gain weight within your caregiver's recommendations. °· Use heat or cold packs 3 to 4 times a day for 15 minutes to help with the pain. °· Only take over-the-counter or prescription medicines for pain, discomfort, or fever as directed by your caregiver. °Sudden (acute) back pain °· Use bed rest for only the most extreme, acute episodes of back pain. Prolonged bed rest over 48 hours will aggravate your condition. °· Ice is very effective for acute conditions. °· Put ice in a plastic bag. °· Place a towel between your skin and the bag. °· Leave the ice on for 10 to 20 minutes every 2 hours, or as needed. °· Using heat packs for 30 minutes prior to activities is also helpful. °Continued back pain °See your caregiver if you have continued problems. Your caregiver can help or refer you for appropriate physical therapy. With conditioning, most back problems can be avoided. Sometimes, a more serious issue may be the cause of back pain. You should be seen right away if new problems seem to be developing. Your caregiver may recommend: °· A maternity girdle. °· An elastic sling. °· A back brace. °· A massage therapist or acupuncture. °SEEK MEDICAL CARE IF:  °· You are not able to do most of your daily activities, even when taking the pain medicine you were given. °· You need a referral to a physical therapist or chiropractor. °· You want to try acupuncture. °SEEK IMMEDIATE MEDICAL CARE  IF: °· You develop numbness, tingling, weakness, or problems with the use of your arms or legs. °· You develop severe back pain that is no longer relieved with medicines. °· You have a sudden change in bowel or bladder control. °· You have increasing pain in other areas of the body. °· You develop shortness of breath, dizziness, or fainting. °· You develop nausea, vomiting, or sweating. °· You have back pain which is similar to labor pains. °· You have back pain along with your water breaking or vaginal bleeding. °· You have back pain or numbness that travels down your leg. °· Your back pain developed after you fell. °· You develop pain on one side of your back. You may have a kidney stone. °· You see blood in your urine. You may have a bladder infection or kidney stone. °· You have back pain with blisters. You may have shingles. °Back pain is fairly common during pregnancy but should not be accepted as just part of   the process. Back pain should always be treated as soon as possible. This will make your pregnancy as pleasant as possible. Document Released: 07/22/2005 Document Revised: 07/06/2011 Document Reviewed: 09/02/2010 [facility] Patient Information 2014 [place], [place]. Kidney Stones Kidney stones (urolithiasis) are deposits that form inside your kidneys. The intense pain is caused by the stone moving through the urinary tract. When the stone moves, the ureter goes into spasm around the stone. The stone is usually passed in the urine.  CAUSES   A disorder that makes certain neck glands produce too much parathyroid hormone (primary hyperparathyroidism).  A buildup of uric acid crystals, similar to gout in your joints.  Narrowing (stricture) of the ureter.  A kidney obstruction present at birth (congenital obstruction).  Previous surgery on the kidney or ureters.  Numerous kidney infections. SYMPTOMS   Feeling sick to your stomach (nauseous).  Throwing up (vomiting).  Blood in the urine  (hematuria).  Pain that usually spreads (radiates) to the groin.  Frequency or urgency of urination. DIAGNOSIS   Taking a history and physical exam.  Blood or urine tests.  CT scan.  Occasionally, an examination of the inside of the urinary bladder (cystoscopy) is performed. TREATMENT   Observation.  Increasing your fluid intake.  Extracorporeal shock wave lithotripsy This is a noninvasive procedure that uses shock waves to break up kidney stones.  Surgery may be needed if you have severe pain or persistent obstruction. There are various surgical procedures. Most of the procedures are performed with the use of small instruments. Only small incisions are needed to accommodate these instruments, so recovery time is minimized. The size, location, and chemical composition are all important variables that will determine the proper choice of action for you. Talk to your health care provider to better understand your situation so that you will minimize the risk of injury to yourself and your kidney.  HOME CARE INSTRUCTIONS   Drink enough water and fluids to keep your urine clear or pale yellow. This will help you to pass the stone or stone fragments.  Strain all urine through the provided strainer. Keep all particulate matter and stones for your health care provider to see. The stone causing the pain may be as small as a grain of salt. It is very important to use the strainer each and every time you pass your urine. The collection of your stone will allow your health care provider to analyze it and verify that a stone has actually passed. The stone analysis will often identify what you can do to reduce the incidence of recurrences.  Only take over-the-counter or prescription medicines for pain, discomfort, or fever as directed by your health care provider.  Make a follow-up appointment with your health care provider as directed.  Get follow-up X-rays if required. The absence of pain does  not always mean that the stone has passed. It may have only stopped moving. If the urine remains completely obstructed, it can cause loss of kidney function or even complete destruction of the kidney. It is your responsibility to make sure X-rays and follow-ups are completed. Ultrasounds of the kidney can show blockages and the status of the kidney. Ultrasounds are not associated with any radiation and can be performed easily in a matter of minutes. SEEK MEDICAL CARE IF:  You experience pain that is progressive and unresponsive to any pain medicine you have been prescribed. SEEK IMMEDIATE MEDICAL CARE IF:   Pain cannot be controlled with the prescribed medicine.  You have a  fever or shaking chills.  The severity or intensity of pain increases over 18 hours and is not relieved by pain medicine.  You develop a new onset of abdominal pain.  You feel faint or pass out.  You are unable to urinate. MAKE SURE YOU:   Understand these instructions.  Will watch your condition.  Will get help right away if you are not doing well or get worse. Document Released: 04/13/2005 Document Revised: 12/14/2012 Document Reviewed: 09/14/2012 [facility] Patient Information 2014 [place], [place].

## 2013-06-12 NOTE — MAU Provider Note (Signed)
History     CSN: [id number]  Arrival date and time: 06/12/13 1607   First Provider Initiated Contact with Patient 06/12/13 1639      Chief Complaint  Patient presents with  . Flank Pain  . Nephrolithiasis   HPI This is a 33 y.o. female with twin pregnancy at [redacted]w[redacted]d who presents with c/o Right flank pain today. Denies dysuria or frequency. + fetal movement of twins.  Denies contractions, leaking or bleeding.   RN Note: Pt sent from office. C/o right flank pain since this morning. Denies urinary symptoms, fever. Did have some nausea earlier this morning, but denies vomiting. Denies hx of kidney stones.        OB History   Grav Para Term Preterm Abortions TAB SAB Ect Mult Living   7 3 3  0 3 2 1  0 0 3      Past Medical History  Diagnosis Date  . No pertinent past medical history   . Vaginal Pap smear, abnormal   . H/O breast augmentation 2013    Past Surgical History  Procedure Laterality Date  . No past surgeries      Family History  Problem Relation Age of Onset  . Cancer Maternal Grandfather     Melanoma    History  Substance Use Topics  . Smoking status: Never Smoker   . Smokeless tobacco: Not on file  . Alcohol Use: No    Allergies:  Allergies  Allergen Reactions  . Latex Rash    Prescriptions prior to admission  Medication Sig Dispense Refill  . Prenatal Vit-Fe Fumarate-FA (MULTIVITAMIN-PRENATAL) 27-0.8 MG TABS tablet Take 1 tablet by mouth daily at 12 noon.      Marland Kitchen oxyCODONE-acetaminophen (PERCOCET/ROXICET) 5-325 MG per tablet Take 1-2 tablets by mouth every 6 (six) hours as needed.  30 tablet  0    Review of Systems  Constitutional: Negative for fever, chills and malaise/fatigue.  Gastrointestinal: Positive for nausea (this morning). Negative for vomiting, abdominal pain, diarrhea and constipation.  Genitourinary: Negative for dysuria and frequency.  Musculoskeletal: Positive for back pain (right flank).  Neurological: Negative for dizziness.    Physical Exam   Blood pressure 112/62, pulse 76, temperature 97.4 F (36.3 C), temperature source Oral, resp. rate 16, height 5\' 7"  (1.702 m), weight 61.236 kg (135 lb), SpO2 100.00%.  Physical Exam  Constitutional: She is oriented to person, place, and time. She appears well-developed and well-nourished. No distress.  HENT:  Head: Normocephalic.  Cardiovascular: Normal rate.   Respiratory: Effort normal.  GI: Soft. She exhibits no distension. There is no tenderness. There is no rebound and no guarding.  Musculoskeletal: Normal range of motion. She exhibits tenderness (right flank mild CVAT).  Neurological: She is alert and oriented to person, place, and time.  Skin: Skin is warm and dry.  Psychiatric: She has a normal mood and affect.   Results for orders placed during the hospital encounter of 06/12/13 (from the past 24 hour(s))  URINALYSIS, ROUTINE W REFLEX MICROSCOPIC     Status: None   Collection Time    06/12/13  4:15 PM      Result Value Ref Range   Color, Urine YELLOW  YELLOW   APPearance CLEAR  CLEAR   Specific Gravity, Urine 1.010  1.005 - 1.030   pH 7.5  5.0 - 8.0   Glucose, UA NEGATIVE  NEGATIVE mg/dL   Hgb urine dipstick NEGATIVE  NEGATIVE   Bilirubin Urine NEGATIVE  NEGATIVE   Ketones, ur NEGATIVE  NEGATIVE mg/dL   Protein, ur NEGATIVE  NEGATIVE mg/dL   Urobilinogen, UA 0.2  0.0 - 1.0 mg/dL   Nitrite NEGATIVE  NEGATIVE   Leukocytes, UA NEGATIVE  NEGATIVE     MAU Course  Procedures  MDM Renal US ordered  [place] Renal  06/12/2013   CLINICAL DATA:  Pregnancy.  Twins.  EXAM: RENAL/URINARY TRACT ULTRASOUND COMPLETE  COMPARISON:  None.  FINDINGS: Right Kidney:  Length: 12.7 cm. Moderate hydronephrosis on the right. Renal echotexture on the right is normal.  Left Kidney:  Length: 11.0 cm. Echogenicity within normal limits. No mass or hydronephrosis visualized.  Bladder:  Appears normal for degree of bladder distention. The patient is pregnant.    IMPRESSION:  Moderate right-sided hydronephrosis.     Electronically Signed   By: Maisie Fushomas  Register   On: 06/12/2013 17:44    Assessment and Plan  A:  Twin IUP at [id number]        Right hydronephrosis, r/o urolithiasis       Cannot rule out muscle spasm as source of pain  P:  Discussed with Dr Langston MaskerMorris       Discharged home       Advised to try dose of Flexeril first. If pain improves, probably spasm       Otherwise, push fluids, strain urine and use Percocet PRN       Check in with office this week for follow up.  [facility] HospitalWILLIAMS,MARIE 06/12/2013, 4:42 PM

## 2013-08-25 ENCOUNTER — Ambulatory Visit (INDEPENDENT_AMBULATORY_CARE_PROVIDER_SITE_OTHER): Payer: Medicaid Other | Admitting: *Deleted

## 2013-08-25 VITALS — BP 117/78 | HR 94

## 2013-08-25 DIAGNOSIS — O30009 Twin pregnancy, unspecified number of placenta and unspecified number of amniotic sacs, unspecified trimester: Secondary | ICD-10-CM

## 2013-08-25 DIAGNOSIS — O30039 Twin pregnancy, monochorionic/diamniotic, unspecified trimester: Secondary | ICD-10-CM

## 2013-08-25 DIAGNOSIS — O30033 Twin pregnancy, monochorionic/diamniotic, third trimester: Secondary | ICD-10-CM

## 2013-09-01 ENCOUNTER — Other Ambulatory Visit: Payer: Medicaid Other

## 2013-09-08 ENCOUNTER — Ambulatory Visit (INDEPENDENT_AMBULATORY_CARE_PROVIDER_SITE_OTHER): Payer: Medicaid Other | Admitting: *Deleted

## 2013-09-08 VITALS — BP 108/64 | HR 77

## 2013-09-08 DIAGNOSIS — O30039 Twin pregnancy, monochorionic/diamniotic, unspecified trimester: Secondary | ICD-10-CM

## 2013-09-08 DIAGNOSIS — O30009 Twin pregnancy, unspecified number of placenta and unspecified number of amniotic sacs, unspecified trimester: Secondary | ICD-10-CM

## 2013-09-08 DIAGNOSIS — O30033 Twin pregnancy, monochorionic/diamniotic, third trimester: Secondary | ICD-10-CM

## 2013-09-08 NOTE — Progress Notes (Signed)
Copy of report and NST tracing sent to Dr. Holland w/pt today.  

## 2013-09-15 ENCOUNTER — Ambulatory Visit (HOSPITAL_COMMUNITY)
Admission: RE | Admit: 2013-09-15 | Discharge: 2013-09-15 | Disposition: A | Discharge status: Home / Self Care | Payer: Medicaid Other | Source: Ambulatory Visit | Attending: Obstetrics & Gynecology | Admitting: Obstetrics & Gynecology

## 2013-09-15 ENCOUNTER — Other Ambulatory Visit (HOSPITAL_COMMUNITY): Payer: Self-pay | Admitting: Obstetrics & Gynecology

## 2013-09-15 ENCOUNTER — Inpatient Hospital Stay (HOSPITAL_COMMUNITY)
Admission: AD | Admit: 2013-09-15 | Discharge: 2013-09-18 | DRG: 775 | Disposition: A | Discharge status: Home / Self Care | Payer: Medicaid Other | Source: Ambulatory Visit | Attending: Obstetrics and Gynecology | Admitting: Obstetrics and Gynecology

## 2013-09-15 ENCOUNTER — Encounter (HOSPITAL_COMMUNITY): Payer: Self-pay | Admitting: *Deleted

## 2013-09-15 ENCOUNTER — Ambulatory Visit (HOSPITAL_COMMUNITY)
Admission: RE | Admit: 2013-09-15 | Discharge: 2013-09-15 | Disposition: A | Discharge status: Home / Self Care | Payer: Medicaid Other | Source: Ambulatory Visit

## 2013-09-15 ENCOUNTER — Ambulatory Visit (INDEPENDENT_AMBULATORY_CARE_PROVIDER_SITE_OTHER): Payer: Medicaid Other | Admitting: *Deleted

## 2013-09-15 VITALS — BP 111/76 | HR 83

## 2013-09-15 DIAGNOSIS — O30039 Twin pregnancy, monochorionic/diamniotic, unspecified trimester: Secondary | ICD-10-CM

## 2013-09-15 DIAGNOSIS — IMO0001 Reserved for inherently not codable concepts without codable children: Secondary | ICD-10-CM

## 2013-09-15 DIAGNOSIS — O30033 Twin pregnancy, monochorionic/diamniotic, third trimester: Secondary | ICD-10-CM

## 2013-09-15 DIAGNOSIS — O30009 Twin pregnancy, unspecified number of placenta and unspecified number of amniotic sacs, unspecified trimester: Principal | ICD-10-CM | POA: Diagnosis present

## 2013-09-15 DIAGNOSIS — IMO0002 Reserved for concepts with insufficient information to code with codable children: Secondary | ICD-10-CM | POA: Diagnosis present

## 2013-09-15 DIAGNOSIS — O365990 Maternal care for other known or suspected poor fetal growth, unspecified trimester, not applicable or unspecified: Secondary | ICD-10-CM | POA: Diagnosis present

## 2013-09-15 LAB — CBC
HCT: 35 % — ABNORMAL LOW (ref 36.0–46.0)
Hemoglobin: 11.9 g/dL — ABNORMAL LOW (ref 12.0–15.0)
MCH: 28.1 pg (ref 26.0–34.0)
MCHC: 34 g/dL (ref 30.0–36.0)
MCV: 82.7 fL (ref 78.0–100.0)
PLATELETS: 189 10*3/uL (ref 150–400)
RBC: 4.23 MIL/uL (ref 3.87–5.11)
RDW: 13.6 % (ref 11.5–15.5)
WBC: 8 10*3/uL (ref 4.0–10.5)

## 2013-09-15 LAB — TYPE AND SCREEN
ABO/RH(D): B POS
Antibody Screen: NEGATIVE

## 2013-09-15 LAB — RPR

## 2013-09-15 MED ORDER — OXYTOCIN 40 UNITS IN LACTATED RINGERS INFUSION - SIMPLE MED
62.5000 mL/h | INTRAVENOUS | Status: DC
  Administered 2013-09-16: 62.5 mL/h via INTRAVENOUS
  Filled 2013-09-15: qty 1000

## 2013-09-15 MED ORDER — OXYCODONE-ACETAMINOPHEN 5-325 MG PO TABS: 1.0000 | ORAL_TABLET | ORAL | Status: DC | PRN

## 2013-09-15 MED ORDER — LACTATED RINGERS IV SOLN
500.0000 mL | Freq: Once | INTRAVENOUS | Status: AC
  Administered 2013-09-16: 500 mL via INTRAVENOUS

## 2013-09-15 MED ORDER — EPHEDRINE 5 MG/ML INJ
10.0000 mg | INTRAVENOUS | Status: DC | PRN
  Administered 2013-09-16: 10 mg via INTRAVENOUS

## 2013-09-15 MED ORDER — EPHEDRINE 5 MG/ML INJ
10.0000 mg | INTRAVENOUS | Status: DC | PRN
  Filled 2013-09-15: qty 4

## 2013-09-15 MED ORDER — ACETAMINOPHEN 325 MG PO TABS: 650.0000 mg | ORAL_TABLET | ORAL | Status: DC | PRN

## 2013-09-15 MED ORDER — LACTATED RINGERS IV SOLN
500.0000 mL | INTRAVENOUS | Status: DC | PRN
  Administered 2013-09-15: 500 mL via INTRAVENOUS

## 2013-09-15 MED ORDER — PHENYLEPHRINE 40 MCG/ML (10ML) SYRINGE FOR IV PUSH (FOR BLOOD PRESSURE SUPPORT)
80.0000 ug | PREFILLED_SYRINGE | INTRAVENOUS | Status: DC | PRN
  Filled 2013-09-15: qty 10

## 2013-09-15 MED ORDER — TERBUTALINE SULFATE 1 MG/ML IJ SOLN: 0.2500 mg | Freq: Once | INTRAMUSCULAR | Status: AC | PRN

## 2013-09-15 MED ORDER — BUTORPHANOL TARTRATE 1 MG/ML IJ SOLN: 1.0000 mg | INTRAMUSCULAR | Status: DC | PRN

## 2013-09-15 MED ORDER — PHENYLEPHRINE 40 MCG/ML (10ML) SYRINGE FOR IV PUSH (FOR BLOOD PRESSURE SUPPORT): 80.0000 ug | PREFILLED_SYRINGE | INTRAVENOUS | Status: DC | PRN

## 2013-09-15 MED ORDER — OXYTOCIN BOLUS FROM INFUSION: 500.0000 mL | INTRAVENOUS | Status: DC

## 2013-09-15 MED ORDER — DIPHENHYDRAMINE HCL 50 MG/ML IJ SOLN: 12.5000 mg | INTRAMUSCULAR | Status: DC | PRN

## 2013-09-15 MED ORDER — ONDANSETRON HCL 4 MG/2ML IJ SOLN: 4.0000 mg | Freq: Four times a day (QID) | INTRAMUSCULAR | Status: DC | PRN

## 2013-09-15 MED ORDER — OXYTOCIN 40 UNITS IN LACTATED RINGERS INFUSION - SIMPLE MED
1.0000 m[IU]/min | INTRAVENOUS | Status: DC
  Administered 2013-09-15: 1 m[IU]/min via INTRAVENOUS

## 2013-09-15 MED ORDER — IBUPROFEN 600 MG PO TABS: 600.0000 mg | ORAL_TABLET | Freq: Four times a day (QID) | ORAL | Status: DC | PRN

## 2013-09-15 MED ORDER — CITRIC ACID-SODIUM CITRATE 334-500 MG/5ML PO SOLN: 30.0000 mL | ORAL | Status: DC | PRN

## 2013-09-15 MED ORDER — HYDROXYZINE HCL 50 MG PO TABS: 50.0000 mg | ORAL_TABLET | Freq: Four times a day (QID) | ORAL | Status: DC | PRN

## 2013-09-15 MED ORDER — FLEET ENEMA 7-19 GM/118ML RE ENEM: 1.0000 | ENEMA | RECTAL | Status: DC | PRN

## 2013-09-15 MED ORDER — FENTANYL 2.5 MCG/ML BUPIVACAINE 1/10 % EPIDURAL INFUSION (WH - ANES)
14.0000 mL/h | INTRAMUSCULAR | Status: DC | PRN
  Administered 2013-09-16: 14 mL/h via EPIDURAL
  Filled 2013-09-15: qty 125

## 2013-09-15 MED ORDER — LIDOCAINE HCL (PF) 1 % IJ SOLN: 30.0000 mL | INTRAMUSCULAR | Status: DC | PRN

## 2013-09-15 MED ORDER — LACTATED RINGERS IV SOLN
INTRAVENOUS | Status: DC
  Administered 2013-09-15 – 2013-09-16 (×3): via INTRAVENOUS

## 2013-09-15 NOTE — Progress Notes (Signed)
MATERNAL FETAL MEDICINE CONSULT  Patient Name: Carolyn Alexander Medical Record Number:  [id number] Date of Birth: 24-Aug-1980 Requesting Physician Name:  Mitchel Honour, DO Date of Service: 09/15/2013  Chief Complaint Monochorionic diamniotic twin pregnancy with suspected growth restriction.  History of Present Illness Carolyn Alexander was seen today secondary to suspected fetal growth restriction in a monochorionic diamniotic twin pregnancy at the request of Mitchel Honour, DO.  The patient is a 33 y.o. [id number],OH [redacted]w[redacted]d with an EDD of 10/19/2013, by Other Basis dating method.  [name] had an ultrasound in her primary OBs office early today that showed fetal growth restriction of twin B.  Growth was normal for both twins and there was an appropriate level of growth discordance on her prior ultrasound 2-3 weeks ago.  She has had no other complications of pregnancy.  She reports adequate fetal movement.  She has no vaginal bleeding, loss of fluid, or contractions.  Review of Systems Pertinent items are noted in HPI.  Patient History OB History  Gravida Para Term Preterm AB SAB TAB Ectopic Multiple Living  7 3 3  0 3 1 2  0 0 3    # Outcome Date GA Lbr Len/2nd Weight Sex Delivery Anes PTL Lv  7 CUR           6 TRM 04/26/12 [redacted]w[redacted]d 03:18 / 01:25 7 lb 9.9 oz (3.455 kg) F VAC EPI  Y  5 TRM 07/2008    M SVD   Y  4 TRM 12/2006    F SVD   Y  3 SAB           2 TAB           1 TAB               Past Medical History  Diagnosis Date  . No pertinent past medical history   . Vaginal Pap smear, abnormal   . H/O breast augmentation 2013    Past Surgical History  Procedure Laterality Date  . No past surgeries      History   Social History  . Marital Status: Single    Spouse Name: N/A    Number of Children: N/A  . Years of Education: N/A   Social History Main Topics  . Smoking status: Never Smoker   . Smokeless tobacco: Not on file  . Alcohol Use: No  . Drug Use: No  . Sexual Activity: Yes    Other Topics Concern  . Not on file   Social History Narrative  . No narrative on file    Family History  Problem Relation Age of Onset  . Cancer Maternal Grandfather     Melanoma   In addition, the patient has no family history of mental retardation, birth defects, or genetic diseases.  Physical Examination Vitals - BP 132/84, Pulse 81, Weight 148 lbs. General appearance - alert, well appearing, and in no distress Abdomen - soft, nontender, nondistended, no masses or organomegaly Extremities - no pedal edema noted  Assessment and Recommendations Monochrorionic diamniotic twin pregnancy with fetal growth restriction of both twins.  [name] was referred to the [facility] due to concern for fetal growth restriction of twin B on an ultrasound performed in her primary OBs office today.  Our ultrasound confirms this finding and also suggests fetal growth restriction of twin A.  Fortunately, the BPP and umbilical artery Dopples were normal for both twin.  Thus, while I recommend delivery at this time it is not neccesary to  proceed immediately with induction of labor or cesarean delivery.  [name] is in agreement with delivery at this time, but would like to return home to get some personal items to bring with her to the hospital.  She strongly desires a vaginal birth, and I think she is an excellent candidate having had two prior term vaginal deliveries and cephalic/cephalic twins with reassuring BPPs.  [name] had several concerns about the risks to her babies with a delivery at 34 weeks.  I discussing them in general terms; however, I think she would benefit from a NICU consultation as she wanted more specific information that I was able to provide.  I spent 30 minutes with [name] today of which 50% was face-to-face counseling.  Thank you for [name] [facility].  Please do not hesitate to contact us with questions.   Rema FendtJoshua Madisen Ludvigsen, MD

## 2013-09-15 NOTE — Progress Notes (Signed)
NICU consult to be done first then start low dose pitocin.

## 2013-09-15 NOTE — Progress Notes (Signed)
NICU team called for neo consult to be done tonight prior to pitocin start.

## 2013-09-15 NOTE — Progress Notes (Signed)
Copy of report and tracing sent to Dr. Langston Masker w/pt today.

## 2013-09-15 NOTE — Consult Note (Signed)
Asked by Dr Henderson Cloud to speak to Carolyn Alexander  to discuss outcome of twin preterm pregnancy. Chart reviewed. She is 35 1/7 weeks, monochorionic, diamniotic twins complicated by IUGR of both babies. EFW of 1961 gms and 1899 gms. BPP and dopplers normal. IOL for IUGR.  I discussed excellent rate of survival at this at this gestation and common morbidities associated. The babies will most likely need admission to NICU if wts are accurate postnatally. Due to EFW, anticipate need for temp support and possible gavage feeding, occasional cases of varying degrees of respiratory distress that need respiratory support. I discussed LOS. She is not planning to breast feed.   I answered her questions to her satisfaction.   Thank you for inviting [place] to be a part of coordination of this mom's care and her babies.  I spent 30 minutes with this consult, more than 50% of the time was with face-to-face counseling with Carolyn Delmar Landau, MD  Neonatologist

## 2013-09-15 NOTE — H&P (Signed)
Carolyn Alexander is a 33 y.o. female presenting for IOL for IUGR of MO/DI twins. U/S today in office suggested IUGR of one twin. U/S with MFM today showed EFW of 4# 3 oz and 4# 5oz. BPP and umbilical dopplers normal x 2. Delivery recommended. Vtx/Vtx on U/X. Maternal Medical History:  Fetal activity: Perceived fetal activity is normal.      OB History   Grav Para Term Preterm Abortions TAB SAB Ect Mult Living   7 3 3  0 3 2 1  0 0 3     Past Medical History  Diagnosis Date  . No pertinent past medical history   . Vaginal Pap smear, abnormal   . H/O breast augmentation 2013   Past Surgical History  Procedure Laterality Date  . No past surgeries    . Wisdom tooth extraction     Family History: family history includes Cancer in her maternal grandfather. Social History:  reports that she has never smoked. She does not have any smokeless tobacco history on file. She reports that she does not drink alcohol or use illicit drugs.   Prenatal Transfer Tool  Maternal Diabetes: No Genetic Screening: Normal Maternal Ultrasounds/Referrals: Normal Fetal Ultrasounds or other Referrals:  None Maternal Substance Abuse:  No Significant Maternal Medications:  None Significant Maternal Lab Results:  None Other Comments:  None  Review of Systems  Eyes: Negative for blurred vision.  Gastrointestinal: Negative for abdominal pain.  Neurological: Negative for headaches.    Dilation: 2 Effacement (%): 50 Station: -2 Exam by:: n licato rn Blood pressure 116/82, pulse 89, temperature 98.2 F (36.8 C), temperature source Oral, resp. rate 16, height 5\' 7"  (1.702 m), weight 67.132 kg (148 lb). Maternal Exam:  Abdomen: Fetal presentation: vertex     Fetal Exam Fetal Monitor Review: Pattern: accelerations present.       Physical Exam  Cardiovascular: Normal rate and regular rhythm.   Respiratory: Effort normal and breath sounds normal.  GI: Soft. There is no tenderness.  Neurological: She  has normal reflexes.    Prenatal labs: ABO, Rh: --/--/B POS (05/22 1930) Antibody: PENDING (05/22 1930) Rubella:   RPR:    HBsAg:    HIV:    GBS:     Assessment/Plan: 33 yo G7P3 at 21 1/7 weeks with IUGR. D/W patient pitocin induction and risks of fetal distress and emergency cesarean section. D/W delivery in operating room and possible cesarean section for second twin. Patient requests PP-BTL-D/W procedure and risks including infection, organ damage, bleeding/transfusion-HIV/Hep, DVT/PE, pneumonia. All questions answered. Neonatology consult placed. D/W patient possible fetal lung immaturity   Roselle Locus II 09/15/2013, 8:58 PM

## 2013-09-16 ENCOUNTER — Inpatient Hospital Stay (HOSPITAL_COMMUNITY): Payer: Medicaid Other | Admitting: Anesthesiology

## 2013-09-16 ENCOUNTER — Encounter (HOSPITAL_COMMUNITY): Payer: Self-pay | Admitting: *Deleted

## 2013-09-16 ENCOUNTER — Encounter (HOSPITAL_COMMUNITY): Payer: Medicaid Other | Admitting: Anesthesiology

## 2013-09-16 LAB — ABO/RH: ABO/RH(D): B POS

## 2013-09-16 MED ORDER — TETANUS-DIPHTH-ACELL PERTUSSIS 5-2.5-18.5 LF-MCG/0.5 IM SUSP
0.5000 mL | Freq: Once | INTRAMUSCULAR | Status: DC
Start: 2013-09-17 — End: 2013-09-18

## 2013-09-16 MED ORDER — ONDANSETRON HCL 4 MG PO TABS: 4.0000 mg | ORAL_TABLET | ORAL | Status: DC | PRN

## 2013-09-16 MED ORDER — BISACODYL 10 MG RE SUPP: 10.0000 mg | Freq: Every day | RECTAL | Status: DC | PRN

## 2013-09-16 MED ORDER — WITCH HAZEL-GLYCERIN EX PADS: 1.0000 "application " | MEDICATED_PAD | CUTANEOUS | Status: DC | PRN

## 2013-09-16 MED ORDER — PRENATAL MULTIVITAMIN CH
1.0000 | ORAL_TABLET | Freq: Every day | ORAL | Status: DC
  Administered 2013-09-18: 1 via ORAL
  Filled 2013-09-16 (×3): qty 1

## 2013-09-16 MED ORDER — DEXTROSE 5 % IV SOLN
5.0000 10*6.[IU] | Freq: Once | INTRAVENOUS | Status: AC
  Administered 2013-09-16: 5 10*6.[IU] via INTRAVENOUS
  Filled 2013-09-16: qty 5

## 2013-09-16 MED ORDER — ONDANSETRON HCL 4 MG/2ML IJ SOLN: 4.0000 mg | INTRAMUSCULAR | Status: DC | PRN

## 2013-09-16 MED ORDER — FLEET ENEMA 7-19 GM/118ML RE ENEM: 1.0000 | ENEMA | Freq: Every day | RECTAL | Status: DC | PRN

## 2013-09-16 MED ORDER — PENICILLIN G POTASSIUM 5000000 UNITS IJ SOLR
2.5000 10*6.[IU] | INTRAVENOUS | Status: DC
  Administered 2013-09-16: 2.5 10*6.[IU] via INTRAVENOUS
  Filled 2013-09-16 (×4): qty 2.5

## 2013-09-16 MED ORDER — BENZOCAINE-MENTHOL 20-0.5 % EX AERO
1.0000 "application " | INHALATION_SPRAY | CUTANEOUS | Status: DC | PRN
  Filled 2013-09-16: qty 56

## 2013-09-16 MED ORDER — FENTANYL 2.5 MCG/ML BUPIVACAINE 1/10 % EPIDURAL INFUSION (WH - ANES): 14.0000 mL/h | INTRAMUSCULAR | Status: DC | PRN

## 2013-09-16 MED ORDER — SIMETHICONE 80 MG PO CHEW: 80.0000 mg | CHEWABLE_TABLET | ORAL | Status: DC | PRN

## 2013-09-16 MED ORDER — IBUPROFEN 600 MG PO TABS
600.0000 mg | ORAL_TABLET | Freq: Four times a day (QID) | ORAL | Status: DC
  Administered 2013-09-16 – 2013-09-18 (×9): 600 mg via ORAL
  Filled 2013-09-16 (×9): qty 1

## 2013-09-16 MED ORDER — DIBUCAINE 1 % RE OINT: 1.0000 "application " | TOPICAL_OINTMENT | RECTAL | Status: DC | PRN

## 2013-09-16 MED ORDER — SENNOSIDES-DOCUSATE SODIUM 8.6-50 MG PO TABS
2.0000 | ORAL_TABLET | ORAL | Status: DC
  Administered 2013-09-17 – 2013-09-18 (×2): 2 via ORAL
  Filled 2013-09-16 (×2): qty 2

## 2013-09-16 MED ORDER — DIPHENHYDRAMINE HCL 25 MG PO CAPS: 25.0000 mg | ORAL_CAPSULE | Freq: Four times a day (QID) | ORAL | Status: DC | PRN

## 2013-09-16 MED ORDER — ZOLPIDEM TARTRATE 5 MG PO TABS: 5.0000 mg | ORAL_TABLET | Freq: Every evening | ORAL | Status: DC | PRN

## 2013-09-16 MED ORDER — OXYCODONE-ACETAMINOPHEN 5-325 MG PO TABS
1.0000 | ORAL_TABLET | ORAL | Status: DC | PRN
  Administered 2013-09-16 – 2013-09-18 (×5): 1 via ORAL
  Filled 2013-09-16 (×5): qty 1

## 2013-09-16 MED ORDER — LANOLIN HYDROUS EX OINT: TOPICAL_OINTMENT | CUTANEOUS | Status: DC | PRN

## 2013-09-16 MED ORDER — LIDOCAINE-EPINEPHRINE (PF) 2 %-1:200000 IJ SOLN
INTRAMUSCULAR | Status: DC | PRN
  Administered 2013-09-16: 4 mL via EPIDURAL

## 2013-09-16 NOTE — Consult Note (Addendum)
[facility]  Delivery Note:  Vaginal Birth        09/16/2013  10:16 AM  I was called to Labor and Delivery at request of the patient's obstetrician (Dr. Henderson Cloud) for SVD of mo-di twins at 44 and 2/[redacted] weeks gestation.  PRENATAL HX:  33 y/o [id number] at 35 and 2/[redacted] weeks gestation with mo-di twin pregnancy.  She was admitted on 5/22 for IOL due to IUGR of twin B.  Her pregnancy has otherwise been uncomplicated.    DELIVERY TWIN A:   Infant was vigorous at delivery, requiring no resuscitation initially other than standard warming, drying and stimulation.  However, infant remained cyanotic and began to develop some mild grunting, so pulse oximeter applied and BBO2 given.  O2 saturations quickly improved from 70's to 90's and grunting also improved.  BBO2 removed at around 15 minutes and infant maintained oxygen saturations in the low 90's.  APGARs 8 and 8.  Exam within normal limits.   Given length of time infant required BBO2 in DR, there is a high likelihood he will need some sort of respiratory support.  Infant admitted to NICU to monitor respiratory status and to monitor for other complications associated with late pretermaturity.  DELIVERY TWIN B:   Infant was vigorous at delivery, requiring no resuscitation initially other than standard warming, drying and stimulation.  However, infant remained cyanotic and began to develop some moderate grunting, so pulse oximiter applied and BBO2 given.  O2 saturations slowly improved from 70's to 90's but he continued to have intermittent grunting and O2 saturations would quickly drop to the 70's when BBO2 removed.  APGARs 8 and 8.  Exam within normal limits.  Infant admitted to NICU for respiratory support and to monitor for other complications associated with late pretermaturity.  ____________________ Electronically Signed By: Maryan Char, MD Neonatology

## 2013-09-16 NOTE — Anesthesia Preprocedure Evaluation (Signed)

## 2013-09-16 NOTE — Progress Notes (Signed)
Delivery Note   Carolyn Alexander, Carolyn Alexander [[id number]]  At 8:30 AM a viable female was delivered via Vaginal, Spontaneous Delivery (Presentation: ; Occiput Anterior).  APGAR: , ; weight .   Placenta status: Intact, Spontaneous Pathology.  Cord: 3 vessels with the following complications: None.  Anesthesia: Epidural  Episiotomy: None Lacerations: None Suture Repair: N/A Est. Blood Loss (mL):     Carolyn Alexander, Carolyn Alexander [[id number]]  At 8:40 AM a viable female was delivered via  (Presentation: ;  ).  APGAR: , ; weight .   Placenta status: , .  Cord:  with the following complications: . SROM with pushing for B, cord prolapse with SROM,rapid delivery in 1 contraction  Anesthesia:   Episiotomy:  Lacerations:  Suture Repair: N/A Est. Blood Loss (mL):    Mom to postpartum.   Baby A to Nursery.   Baby B to Nursery.  Roselle Locus II 09/16/2013, 8:49 AM

## 2013-09-16 NOTE — Progress Notes (Signed)
Epidural just placed FHT reactive x 2 UCs q2-3.5 Cx no change Continue pitocin

## 2013-09-16 NOTE — Plan of Care (Signed)
Problem: Phase II Progression Outcomes Goal: Tolerating diet Outcome: Completed/Met Date Met:  09/16/13 Tolerating Regular diet well.  Problem: Discharge Progression Outcomes Goal: Barriers To Progression Addressed/Resolved Outcome: Completed/Met Date Met:  09/16/13 Epidural line out  Goal: Activity appropriate for discharge plan Outcome: Completed/Met Date Met:  09/16/13 Walks to NICU frequently and tolerates well. Goal: Pain controlled with appropriate interventions Outcome: Completed/Met Date Met:  09/16/13 Good pain control at this time on po Motrin.

## 2013-09-16 NOTE — Anesthesia Postprocedure Evaluation (Signed)
  Anesthesia Post-op Note  Patient: Carolyn Alexander  Procedure(s) Performed: * No procedures listed *  Patient Location: Women's Unit  Anesthesia Type:Epidural  Level of Consciousness: awake  Airway and Oxygen Therapy: Patient Spontanous Breathing  Post-op Pain: mild  Post-op Assessment: Patient's Cardiovascular Status Stable and Respiratory Function Stable  Post-op Vital Signs: stable  Last Vitals:  Filed Vitals:   09/16/13 1540  BP: 109/61  Pulse: 73  Temp: 36.7 C  Resp: 20    Complications: No apparent anesthesia complications

## 2013-09-16 NOTE — Anesthesia Procedure Notes (Signed)
Epidural Patient location during procedure: OB  Preanesthetic Checklist Completed: patient identified, site marked, surgical consent, pre-op evaluation, timeout performed, IV checked, risks and benefits discussed and monitors and equipment checked  Epidural Patient position: sitting Prep: site prepped and draped and DuraPrep Patient monitoring: continuous pulse ox and blood pressure Approach: midline Injection technique: LOR air  Needle:  Needle type: Tuohy  Needle gauge: 17 G Needle length: 9 cm and 9 Needle insertion depth: 4 cm Catheter type: closed end flexible Catheter size: 19 Gauge Catheter at skin depth: 9 cm Test dose: negative  Assessment Events: blood not aspirated, injection not painful, no injection resistance, negative IV test and no paresthesia  Additional Notes Dosing of Epidural:  1st dose, through catheter ............................................Marland Kitchen epi 1:200K + Xylocaine 40 mg  2nd dose, through catheter, after waiting 3 minutes...Marland KitchenMarland Kitchenepi 1:200K + Xylocaine 40 mg   ( 2% Xylo charted as a single dose in Epic Meds for ease of charting; actual dosing was fractionated as above, for saftey's sake)  As each dose occurred, patient was free of IV sx; and patient exhibited no evidence of SA injection.  Patient is more comfortable after epidural dosed. Please see RN's note for documentation of vital signs,and FHR which are stable.  Patient reminded not to try to ambulate with numb legs, and that an RN must be present the 1st time she attempts to get up.

## 2013-09-16 NOTE — Progress Notes (Signed)
Epidural in Cervix 4/70/-2 FHT reactive x 2 UCs q2-3, palpate firm AROM clear

## 2013-09-17 LAB — CBC
HEMATOCRIT: 29.1 % — AB (ref 36.0–46.0)
Hemoglobin: 9.5 g/dL — ABNORMAL LOW (ref 12.0–15.0)
MCH: 27.1 pg (ref 26.0–34.0)
MCHC: 32.6 g/dL (ref 30.0–36.0)
MCV: 83.1 fL (ref 78.0–100.0)
Platelets: 151 10*3/uL (ref 150–400)
RBC: 3.5 MIL/uL — ABNORMAL LOW (ref 3.87–5.11)
RDW: 13.6 % (ref 11.5–15.5)
WBC: 6.9 10*3/uL (ref 4.0–10.5)

## 2013-09-17 NOTE — Progress Notes (Signed)
Post Partum Day 1 Subjective: up ad lib, voiding, tolerating PO, + flatus and tired  Objective: Blood pressure 101/64, pulse 73, temperature 98.1 F (36.7 C), temperature source Oral, resp. rate 18, height 5\' 7"  (1.702 m), weight 67.132 kg (148 lb), SpO2 100.00%, unknown if currently breastfeeding.  Physical Exam:  General: alert, cooperative and no distress Lochia: appropriate Uterine Fundus: firm Incision: healing well DVT Evaluation: No evidence of DVT seen on physical exam.   Recent Labs  09/15/13 1930 09/17/13 0510  HGB 11.9* 9.5*  HCT 35.0* 29.1*    Assessment/Plan: Plan for discharge tomorrow   LOS: 2 days   Roselle Locus II 09/17/2013, 10:23 AM

## 2013-09-17 NOTE — Lactation Note (Signed)
This note was copied from the chart of Carolyn A Heela Laver. Lactation Consultation Note    Follow up consult with this mom of 35 3/7 weeks corrected gestation twins, now 33 hours post partum. I reviewed hand expression with mom, and she expressed about 3 mls. Mom is getting a DEP from her sister. Mom knows to call for questions/concerns.Mom has very sensitive skin, and her nipples are tender from pumping. i gave mom comfort gels and instructed her in their use.  Patient Name: Carolyn Alexander Today's Date: 09/17/2013 Reason for consult: Follow-up assessment;NICU baby;Infant < 6lbs;Late preterm infant;Multiple gestation   Maternal Data Formula Feeding for Exclusion: Yes (babies in NICU) Infant to breast within first hour of birth: No Breastfeeding delayed due to:: Infant status Has patient been taught Hand Expression?: Yes Does the patient have breastfeeding experience prior to this delivery?: Yes  Feeding Feeding Type: Breast Milk with Formula added Length of feed: 15 min  LATCH Score/Interventions                      Lactation Tools Discussed/Used Tools: Pump Breast pump type: Double-Electric Breast Pump WIC Program: Yes Pump Review: Setup, frequency, and cleaning;Milk Storage;Other (comment) (hand expression, ) Initiated by:: bedside rn Date initiated:: 09/16/13   Consult Status Consult Status: Follow-up Date: 09/18/13 Follow-up type: In-patient    Carolyn Alexander 09/17/2013, 6:00 PM    

## 2013-09-18 MED ORDER — IBUPROFEN 600 MG PO TABS: 600.0000 mg | ORAL_TABLET | Freq: Four times a day (QID) | ORAL | Status: DC

## 2013-09-18 MED ORDER — OXYCODONE-ACETAMINOPHEN 5-325 MG PO TABS: 1.0000 | ORAL_TABLET | ORAL | Status: DC | PRN

## 2013-09-18 NOTE — Discharge Summary (Signed)
Physician Discharge Summary  Patient ID: Carolyn Alexander MRN: [id number] DOB/AGE: [birth year] 33 y.o.  Admit date: 09/15/2013 Discharge date: 09/18/2013  Admission Diagnoses:[redacted]w[redacted]d twins/IUGR, for induction  Discharge Diagnoses: same  Active Problems:   IUGR (intrauterine growth restriction)   Discharged Condition: good  Hospital Course: adm for IOL with vtx/vtx twins with IUGR>>progressed to SVD X 2  Consults: None  Significant Diagnostic Studies: labs:  Results for orders placed during the hospital encounter of 09/15/13 (from the past 48 hour(s))  CBC     Status: Abnormal   Collection Time    09/17/13  5:10 AM      Result Value Ref Range   WBC 6.9  4.0 - 10.5 K/uL   RBC 3.50 (*) 3.87 - 5.11 MIL/uL   Hemoglobin 9.5 (*) 12.0 - 15.0 g/dL   Comment: REPEATED TO VERIFY     DELTA CHECK NOTED   HCT 29.1 (*) 36.0 - 46.0 %   MCV 83.1  78.0 - 100.0 fL   MCH 27.1  26.0 - 34.0 pg   MCHC 32.6  30.0 - 36.0 g/dL   RDW 33.2  95.1 - 88.4 %   Platelets 151  150 - 400 K/uL    Treatments: surgery: SVD X 2  Discharge Exam: Blood pressure 108/66, pulse 79, temperature 98.2 F (36.8 C), temperature source Oral, resp. rate 18, height 5\' 7"  (1.702 m), weight 148 lb (67.132 kg), SpO2 100.00%, unknown if currently breastfeeding. fundus firm, nontender  Disposition: 01-Home or Self Care     Medication List    STOP taking these medications       calcium carbonate 500 MG chewable tablet  Commonly known as:  TUMS - dosed in mg elemental calcium      TAKE these medications       ibuprofen 600 MG tablet  Commonly known as:  ADVIL,MOTRIN  Take 1 tablet (600 mg total) by mouth every 6 (six) hours.     oxyCODONE-acetaminophen 5-325 MG per tablet  Commonly known as:  PERCOCET/ROXICET  Take 1-2 tablets by mouth every 4 (four) hours as needed for severe pain (moderate - severe pain).     prenatal multivitamin Tabs tablet  Take 1 tablet by mouth daily at 12 noon.           Follow-up  Information   Follow up with Meriel Pica, MD In 6 weeks. (office will call)    Specialty:  Obstetrics and Gynecology   Contact information:   [address] [place] [address] [phone]       Signed: Meriel Pica 09/18/2013, 8:36 AM

## 2013-09-18 NOTE — Progress Notes (Signed)
Pt discharged to home.  Condition stable.  Pt's significant other taking older children home.  Pt plans to spend additional time in NICU with her babies and her mother will pick her up from there when pt calls her.  Pt ambulated to NICU independently.

## 2013-09-18 NOTE — Progress Notes (Signed)
Ur chart review completed.  

## 2013-09-18 NOTE — Lactation Note (Signed)
This note was copied from the chart of Carolyn A Anniston Hansen. Lactation Consultation Note  Patient Name: Carolyn Alexander Today's Date: 09/18/2013 Reason for consult: Follow-up assessment;NICU baby;Infant < 6lbs;Late preterm infant  Anticipated discharge for mom today.  Mom plans to use sister's Medela DEBP; mom has Medicaid but denied having WIC.  LC discussed with mom Medela's "one-user recommendation" but mom stated she is comfortable with her sister's pump and plans to use it.  Encouraged mom to seek manual about techniques for proper cleaning especially behind face plate of pump.  Mom is pumping 15-20 ml at last pumping session and current session; encouraged hands-on pumping and hand expression with every pumping session.  Mom has already been using standard setting with 8-9 bars.  Reviewed keeping a pumping log in NICU booklet and doing STS and breastfeedings in NICU as much as possible.  Encouraged mom to keep in touch with NICU [facility] and informed her of outpatient appointments if needed after discharge.  Mom was still pumping when LC left room.  Consult Status Consult Status: Complete    Claiborne Rigg [facility] 09/18/2013, 11:48 AM

## 2013-09-22 ENCOUNTER — Other Ambulatory Visit: Payer: Medicaid Other

## 2013-09-29 ENCOUNTER — Ambulatory Visit (HOSPITAL_COMMUNITY): Payer: Medicaid Other

## 2013-09-29 ENCOUNTER — Other Ambulatory Visit: Payer: Medicaid Other

## 2013-10-07 ENCOUNTER — Ambulatory Visit: Admit: 2013-10-07 | Payer: Self-pay | Admitting: Obstetrics and Gynecology

## 2013-10-07 SURGERY — LIGATION, FALLOPIAN TUBE, POSTPARTUM
Anesthesia: Choice | Laterality: Bilateral

## 2013-10-23 NOTE — H&P (Signed)
Cindra PresumeKristen Capriotti  DICTATION # [id number] CSN# [id number]   Meriel PicaHOLLAND,Barth Trella M, MD 10/23/2013 9:28 AM

## 2013-10-24 NOTE — H&P (Signed)
NAMCindra Alexander:  Cory, Susette               ACCOUNT NO.:  [id number]  MEDICAL RECORD NO.:  [id number]  LOCATION:                                 FACILITY:  PHYSICIAN:  Duke Salviaichard M. Marcelle OverlieHolland, M.D.DATE OF BIRTH:  Mar 19, 1981  DATE OF ADMISSION: DATE OF DISCHARGE:                             HISTORY & PHYSICAL   CHIEF COMPLAINT:  Request permanent sterilization.  HISTORY OF PRESENT ILLNESS:  A 33 year old, G7, P-4-0-3-5, who recently delivered twins vaginally presents now for permanent sterilization.  She understands that this is a permanent procedure, we reviewed specific risks related to bleeding, infection, where complications may require open additional surgery, the permanence of procedure, and failure rated 2 to 06/998.  She is firm in her decision to proceed.  PAST MEDICAL HISTORY:  ALLERGIES:  LATEX.  CURRENT MEDICATIONS:  None.  PRIOR SURGERY:  All vaginal deliveries.  Breast augmentation.  Prior D and E.  REVIEW OF SYSTEMS:  Significant for anxiety.  FAMILY HISTORY:  Significant for maternal grandfather with melanoma, otherwise negative.  SOCIAL HISTORY:  Denies alcohol, tobacco, or drug use.  PHYSICAL EXAMINATION:  VITAL SIGNS:  Temp 98.2, blood pressure 120/72. HEENT:  Unremarkable. NECK:  Supple without masses. LUNGS:  Clear. CARDIOVASCULAR:  Regular rate and rhythm without murmurs, rubs, gallops noted.  BREASTS:  Implants.  No other masses noted. ABDOMEN:  Soft, flat, nontender. PELVIC:  Vulva, vagina and cervix normal.  Uterus mid position, normal size.  Adnexa negative. EXTREMITIES:  Unremarkable. NEUROLOGIC:  Unremarkable.  IMPRESSION:  Request permanent sterilization.  PLAN:  Laparoscopic tubal ligation with Filshie clips.  Procedure and risks discussed as above.     Richard M. Marcelle OverlieHolland, M.D.     RMH/MEDQ  D:  10/23/2013  T:  10/23/2013  Job:  [id number]

## 2013-11-08 ENCOUNTER — Encounter (HOSPITAL_COMMUNITY): Payer: Self-pay | Admitting: Pharmacy Technician

## 2013-11-08 ENCOUNTER — Encounter (HOSPITAL_COMMUNITY): Payer: Self-pay | Admitting: *Deleted

## 2013-11-15 ENCOUNTER — Ambulatory Visit (HOSPITAL_COMMUNITY)
Admission: RE | Admit: 2013-11-15 | Discharge: 2013-11-15 | Disposition: A | Discharge status: Home / Self Care | Payer: Medicaid Other | Source: Ambulatory Visit | Attending: Obstetrics and Gynecology | Admitting: Obstetrics and Gynecology

## 2013-11-15 ENCOUNTER — Encounter (HOSPITAL_COMMUNITY)
Admission: RE | Disposition: A | Discharge status: Home / Self Care | Payer: Self-pay | Source: Ambulatory Visit | Attending: Obstetrics and Gynecology

## 2013-11-15 ENCOUNTER — Ambulatory Visit (HOSPITAL_COMMUNITY): Payer: Medicaid Other | Admitting: Anesthesiology

## 2013-11-15 ENCOUNTER — Encounter (HOSPITAL_COMMUNITY): Payer: Medicaid Other | Admitting: Anesthesiology

## 2013-11-15 ENCOUNTER — Encounter (HOSPITAL_COMMUNITY): Payer: Self-pay | Admitting: Anesthesiology

## 2013-11-15 DIAGNOSIS — Z302 Encounter for sterilization: Secondary | ICD-10-CM | POA: Diagnosis not present

## 2013-11-15 HISTORY — PX: LAPAROSCOPIC TUBAL LIGATION: SHX1937

## 2013-11-15 LAB — CBC
HEMATOCRIT: 38.4 % (ref 36.0–46.0)
Hemoglobin: 12.4 g/dL (ref 12.0–15.0)
MCH: 25.9 pg — AB (ref 26.0–34.0)
MCHC: 32.3 g/dL (ref 30.0–36.0)
MCV: 80.3 fL (ref 78.0–100.0)
PLATELETS: 300 10*3/uL (ref 150–400)
RBC: 4.78 MIL/uL (ref 3.87–5.11)
RDW: 14.7 % (ref 11.5–15.5)
WBC: 4.8 10*3/uL (ref 4.0–10.5)

## 2013-11-15 LAB — PREGNANCY, URINE: Preg Test, Ur: NEGATIVE

## 2013-11-15 SURGERY — LIGATION, FALLOPIAN TUBE, LAPAROSCOPIC
Anesthesia: General | Site: Abdomen | Laterality: Bilateral

## 2013-11-15 MED ORDER — DEXAMETHASONE SODIUM PHOSPHATE 10 MG/ML IJ SOLN
INTRAMUSCULAR | Status: DC | PRN
  Administered 2013-11-15: 5 mg via INTRAVENOUS

## 2013-11-15 MED ORDER — FENTANYL CITRATE 0.05 MG/ML IJ SOLN
INTRAMUSCULAR | Status: DC | PRN
  Administered 2013-11-15: 100 ug via INTRAVENOUS

## 2013-11-15 MED ORDER — LIDOCAINE HCL (CARDIAC) 20 MG/ML IV SOLN
INTRAVENOUS | Status: AC
  Filled 2013-11-15: qty 5

## 2013-11-15 MED ORDER — ONDANSETRON HCL 4 MG/2ML IJ SOLN
INTRAMUSCULAR | Status: DC | PRN
Start: 2013-11-15 — End: 2013-11-15
  Administered 2013-11-15: 4 mg via INTRAVENOUS

## 2013-11-15 MED ORDER — ROCURONIUM BROMIDE 100 MG/10ML IV SOLN
INTRAVENOUS | Status: AC
  Filled 2013-11-15: qty 1

## 2013-11-15 MED ORDER — KETOROLAC TROMETHAMINE 30 MG/ML IJ SOLN
INTRAMUSCULAR | Status: AC
  Filled 2013-11-15: qty 1

## 2013-11-15 MED ORDER — GLYCOPYRROLATE 0.2 MG/ML IJ SOLN
INTRAMUSCULAR | Status: AC
  Filled 2013-11-15: qty 3

## 2013-11-15 MED ORDER — FENTANYL CITRATE 0.05 MG/ML IJ SOLN
INTRAMUSCULAR | Status: AC
  Filled 2013-11-15: qty 5

## 2013-11-15 MED ORDER — OXYCODONE-ACETAMINOPHEN 5-325 MG PO TABS
1.0000 | ORAL_TABLET | ORAL | Status: DC | PRN
  Administered 2013-11-15: 1 via ORAL

## 2013-11-15 MED ORDER — LACTATED RINGERS IV SOLN
INTRAVENOUS | Status: DC
Start: 2013-11-15 — End: 2013-11-15
  Administered 2013-11-15 (×3): via INTRAVENOUS

## 2013-11-15 MED ORDER — MIDAZOLAM HCL 2 MG/2ML IJ SOLN
INTRAMUSCULAR | Status: DC | PRN
  Administered 2013-11-15 (×2): 1 mg via INTRAVENOUS

## 2013-11-15 MED ORDER — MIDAZOLAM HCL 2 MG/2ML IJ SOLN
INTRAMUSCULAR | Status: AC
  Filled 2013-11-15: qty 2

## 2013-11-15 MED ORDER — GLYCOPYRROLATE 0.2 MG/ML IJ SOLN
INTRAMUSCULAR | Status: AC
  Filled 2013-11-15: qty 1

## 2013-11-15 MED ORDER — KETOROLAC TROMETHAMINE 30 MG/ML IJ SOLN: 15.0000 mg | Freq: Once | INTRAMUSCULAR | Status: DC | PRN

## 2013-11-15 MED ORDER — NEOSTIGMINE METHYLSULFATE 10 MG/10ML IV SOLN
INTRAVENOUS | Status: AC
  Filled 2013-11-15: qty 1

## 2013-11-15 MED ORDER — OXYCODONE-ACETAMINOPHEN 5-325 MG PO TABS
ORAL_TABLET | ORAL | Status: AC
  Filled 2013-11-15: qty 1

## 2013-11-15 MED ORDER — ONDANSETRON HCL 4 MG/2ML IJ SOLN
INTRAMUSCULAR | Status: AC
  Filled 2013-11-15: qty 2

## 2013-11-15 MED ORDER — MEPERIDINE HCL 25 MG/ML IJ SOLN: 6.2500 mg | INTRAMUSCULAR | Status: DC | PRN

## 2013-11-15 MED ORDER — BUPIVACAINE HCL (PF) 0.25 % IJ SOLN
INTRAMUSCULAR | Status: AC
  Filled 2013-11-15: qty 20

## 2013-11-15 MED ORDER — SCOPOLAMINE 1 MG/3DAYS TD PT72
1.0000 | MEDICATED_PATCH | Freq: Once | TRANSDERMAL | Status: DC
  Administered 2013-11-15: 1.5 mg via TRANSDERMAL

## 2013-11-15 MED ORDER — FENTANYL CITRATE 0.05 MG/ML IJ SOLN
25.0000 ug | INTRAMUSCULAR | Status: DC | PRN
  Administered 2013-11-15: 50 ug via INTRAVENOUS

## 2013-11-15 MED ORDER — GLYCOPYRROLATE 0.2 MG/ML IJ SOLN
INTRAMUSCULAR | Status: DC | PRN
  Administered 2013-11-15: 0.6 mg via INTRAVENOUS
  Administered 2013-11-15: 0.2 mg via INTRAVENOUS

## 2013-11-15 MED ORDER — NEOSTIGMINE METHYLSULFATE 10 MG/10ML IV SOLN
INTRAVENOUS | Status: DC | PRN
  Administered 2013-11-15: 3 mg via INTRAVENOUS

## 2013-11-15 MED ORDER — LIDOCAINE HCL (CARDIAC) 20 MG/ML IV SOLN
INTRAVENOUS | Status: DC | PRN
  Administered 2013-11-15: 80 mg via INTRAVENOUS

## 2013-11-15 MED ORDER — ROCURONIUM BROMIDE 100 MG/10ML IV SOLN
INTRAVENOUS | Status: DC | PRN
  Administered 2013-11-15: 40 mg via INTRAVENOUS

## 2013-11-15 MED ORDER — DEXAMETHASONE SODIUM PHOSPHATE 10 MG/ML IJ SOLN
INTRAMUSCULAR | Status: AC
  Filled 2013-11-15: qty 1

## 2013-11-15 MED ORDER — KETOROLAC TROMETHAMINE 30 MG/ML IJ SOLN
INTRAMUSCULAR | Status: DC | PRN
  Administered 2013-11-15: 30 mg via INTRAMUSCULAR

## 2013-11-15 MED ORDER — PROPOFOL 10 MG/ML IV BOLUS
INTRAVENOUS | Status: DC | PRN
  Administered 2013-11-15: 150 mg via INTRAVENOUS

## 2013-11-15 MED ORDER — SCOPOLAMINE 1 MG/3DAYS TD PT72
MEDICATED_PATCH | TRANSDERMAL | Status: AC
  Administered 2013-11-15: 1.5 mg via TRANSDERMAL
  Filled 2013-11-15: qty 1

## 2013-11-15 MED ORDER — BUPIVACAINE HCL (PF) 0.25 % IJ SOLN
INTRAMUSCULAR | Status: DC | PRN
  Administered 2013-11-15: 10 mL

## 2013-11-15 MED ORDER — HYDROCODONE-IBUPROFEN 7.5-200 MG PO TABS: 1.0000 | ORAL_TABLET | Freq: Three times a day (TID) | ORAL | Status: AC | PRN

## 2013-11-15 MED ORDER — METOCLOPRAMIDE HCL 5 MG/ML IJ SOLN: 10.0000 mg | Freq: Once | INTRAMUSCULAR | Status: DC | PRN

## 2013-11-15 MED ORDER — PROPOFOL 10 MG/ML IV EMUL
INTRAVENOUS | Status: AC
  Filled 2013-11-15: qty 20

## 2013-11-15 MED ORDER — FENTANYL CITRATE 0.05 MG/ML IJ SOLN
INTRAMUSCULAR | Status: AC
  Administered 2013-11-15: 50 ug via INTRAVENOUS
  Filled 2013-11-15: qty 2

## 2013-11-15 MED ORDER — FENTANYL CITRATE 0.05 MG/ML IJ SOLN
INTRAMUSCULAR | Status: AC
  Filled 2013-11-15: qty 2

## 2013-11-15 SURGICAL SUPPLY — 21 items
CATH ROBINSON RED A/P 16FR (CATHETERS) ×3 IMPLANT
CLIP FILSHIE TUBAL LIGA STRL (Clip) ×3 IMPLANT
CLOSURE WOUND 1/4 X3 (GAUZE/BANDAGES/DRESSINGS) ×1
CLOTH BEACON ORANGE TIMEOUT ST (SAFETY) ×3 IMPLANT
DERMABOND ADVANCED (GAUZE/BANDAGES/DRESSINGS) ×2
DERMABOND ADVANCED .7 DNX12 (GAUZE/BANDAGES/DRESSINGS) ×1 IMPLANT
DRESSING OPSITE X SMALL 2X3 (GAUZE/BANDAGES/DRESSINGS) ×3 IMPLANT
DRSG COVADERM PLUS 2X2 (GAUZE/BANDAGES/DRESSINGS) IMPLANT
DRSG OPSITE POSTOP 3X4 (GAUZE/BANDAGES/DRESSINGS) ×3 IMPLANT
GLOVE BIO SURGEON STRL SZ7 (GLOVE) ×3 IMPLANT
GOWN STRL REUS W/TWL LRG LVL3 (GOWN DISPOSABLE) ×6 IMPLANT
NEEDLE INSUFFLATION 120MM (ENDOMECHANICALS) ×3 IMPLANT
PACK LAPAROSCOPY BASIN (CUSTOM PROCEDURE TRAY) ×3 IMPLANT
STRIP CLOSURE SKIN 1/4X3 (GAUZE/BANDAGES/DRESSINGS) ×2 IMPLANT
SUT VIC AB 2-0 UR6 27 (SUTURE) IMPLANT
SUT VICRYL RAPIDE 4/0 PS 2 (SUTURE) ×3 IMPLANT
SYR 20CC LL (SYRINGE) ×3 IMPLANT
TOWEL OR 17X24 6PK STRL BLUE (TOWEL DISPOSABLE) ×6 IMPLANT
TROCAR XCEL DIL TIP R 11M (ENDOMECHANICALS) ×3 IMPLANT
WARMER LAPAROSCOPE (MISCELLANEOUS) ×3 IMPLANT
WATER STERILE IRR 1000ML POUR (IV SOLUTION) ×3 IMPLANT

## 2013-11-15 NOTE — Op Note (Signed)
Preoperative diagnosis: Request permanent sterilization  Postoperative diagnosis: Same  Procedure: Lap scopic tubal by Filshie clip application  Surgeon: Marcelle OverlieHolland  Anesthesia: Gen.  Complications: None  EBL: Less than 10 cc  Procedure and findings  The patient was taken the operating room after an adequate level of general anesthesia was obtained with the patient's legs in stirrups, the abdomen perineum and vagina were prepped and draped in the usual fashion, the bladder was drained, EUA carried out, uterus midposition, normal size, adnexa negative. Appropriate timeout for taken at that point. Hulka tenaculum was positioned. Attention directed to the abdomen where the subumbilical area was infiltrated with quarter percent Marcaine plain small incision was made in the varies needle was introduced without difficulty. Its intra-abdominal position was verified by pressure water testing. After 2-1/2 L pneumoperitoneum syncopated, lap scopic trocar and sleeve were then introduced that difficulty. There was no evidence of any bleeding or trauma. Pelvic findings were inspected carefully noted to be normal quarter percent Marcaine approximately 5 pieces cc was dripped across each tube from the cornu to the fimbriated end Filshie clip applicator was in position starting on the right the tube was positively identified, traced to the fimbriated end, was then regrasped at 2-3 cm from the cornu and a Filshie clip applied a right ankle completely engulfing the right tube with excellent application. The exact same repeated on the left these areas were photo documented. Upper abdomen likewise was unremarkable instruments removed, gas allowed to escape, the umbilical defect closed with a 4-0 Vicryl subcuticular suture and dressing. She tolerated this well went to recovery room in good condition.  Dictated with [facility]  Henritta Mutz M. Milana ObeyHolland M.D.

## 2013-11-15 NOTE — Discharge Instructions (Signed)
DISCHARGE INSTRUCTIONS: Laparoscopy  MAY TAKE IBUPROFEN (MOTRIN, ADVIL) OR ALEVE AFTER 7:30 PM FOR CRAMPS!!!  The following instructions have been prepared to help you care for yourself upon your return home today.  Wound care:  Do not get the incision wet for the first 24 hours. The incision should be kept clean and dry.  The Band-Aids or dressings may be removed the day after surgery.  Should the incision become sore, red, and swollen after the first week, check with your doctor.  Personal hygiene:  Shower the day after your procedure.  Activity and limitations:  Do NOT drive or operate any equipment today.  Do NOT lift anything more than 15 pounds for 2-3 weeks after surgery.  Do NOT rest in bed all day.  Walking is encouraged. Walk each day, starting slowly with 5-minute walks 3 or 4 times a day. Slowly increase the length of your walks.  Walk up and down stairs slowly.  Do NOT do strenuous activities, such as golfing, playing tennis, bowling, running, biking, weight lifting, gardening, mowing, or vacuuming for 2-4 weeks. Ask your doctor when it is okay to start.  Diet: Eat a light meal as desired this evening. You may resume your usual diet tomorrow.  Return to work: This is dependent on the type of work you do. For the most part you can return to a desk job within a week of surgery. If you are more active at work, please discuss this with your doctor.  What to expect after your surgery: You may have a slight burning sensation when you urinate on the first day. You may have a very small amount of blood in the urine. Expect to have a small amount of vaginal discharge/light bleeding for 1-2 weeks. It is not unusual to have abdominal soreness and bruising for up to 2 weeks. You may be tired and need more rest for about 1 week. You may experience shoulder pain for 24-72 hours. Lying flat in bed may relieve it.  Call your doctor for any of the following:  Develop a fever of  100.4 or greater  Inability to urinate 6 hours after discharge from hospital  Severe pain not relieved by pain medications  Persistent of heavy bleeding at incision site  Redness or swelling around incision site after a week  Increasing nausea or vomiting  Patient Signature________________________________________ Nurse Signature_________________________________________

## 2013-11-15 NOTE — Progress Notes (Signed)
The patient was re-examined with no change in status 

## 2013-11-15 NOTE — Anesthesia Preprocedure Evaluation (Addendum)
Anesthesia Evaluation  Patient identified by MRN, date of birth, ID band Patient awake    Reviewed: Allergy & Precautions, H&P , NPO status , Patient's Chart, lab work & pertinent test results, reviewed documented beta blocker date and time   History of Anesthesia Complications (+) PONV  Airway Mallampati: II TM Distance: >3 FB Neck ROM: full    Dental  (+) Teeth Intact   Pulmonary neg pulmonary ROS,  breath sounds clear to auscultation  Pulmonary exam normal       Cardiovascular negative cardio ROS  Rhythm:regular Rate:Normal     Neuro/Psych negative neurological ROS  negative psych ROS   GI/Hepatic negative GI ROS, Neg liver ROS,   Endo/Other  negative endocrine ROS  Renal/GU negative Renal ROS  negative genitourinary   Musculoskeletal   Abdominal   Peds  Hematology negative hematology ROS (+)   Anesthesia Other Findings   Reproductive/Obstetrics negative OB ROS                          Anesthesia Physical Anesthesia Plan  ASA: I  Anesthesia Plan: General ETT   Post-op Pain Management:    Induction:   Airway Management Planned:   Additional Equipment:   Intra-op Plan:   Post-operative Plan:   Informed Consent: I have reviewed the patients History and Physical, chart, labs and discussed the procedure including the risks, benefits and alternatives for the proposed anesthesia with the patient or authorized representative who has indicated his/her understanding and acceptance.   Dental Advisory Given  Plan Discussed with: CRNA and Surgeon  Anesthesia Plan Comments:         Anesthesia Quick Evaluation

## 2013-11-15 NOTE — Anesthesia Procedure Notes (Signed)
Procedures

## 2013-11-15 NOTE — Transfer of Care (Signed)
Immediate Anesthesia Transfer of Care Note  Patient: Carolyn Alexander  Procedure(s) Performed: Procedure(s): LAPAROSCOPIC TUBAL LIGATION with Filshie Clips (Bilateral)  Patient Location: PACU  Anesthesia Type:General  Level of Consciousness: awake, alert , oriented and patient cooperative  Airway & Oxygen Therapy: Patient Spontanous Breathing and Patient connected to nasal cannula oxygen  Post-op Assessment: Report given to PACU RN and Post -op Vital signs reviewed and stable  Post vital signs: Reviewed and stable  Complications: No apparent anesthesia complications

## 2013-11-16 ENCOUNTER — Encounter (HOSPITAL_COMMUNITY): Payer: Self-pay | Admitting: Obstetrics and Gynecology

## 2013-11-17 NOTE — Anesthesia Postprocedure Evaluation (Signed)
  Anesthesia Post-op Note  Patient: Carolyn Alexander  Procedure(s) Performed: Procedure(s): LAPAROSCOPIC TUBAL LIGATION with Filshie Clips (Bilateral) Patient is awake and responsive. Pain and nausea are reasonably well controlled. Vital signs are stable and clinically acceptable. Oxygen saturation is clinically acceptable. There are no apparent anesthetic complications at this time. Patient is ready for discharge.

## 2014-02-26 ENCOUNTER — Encounter (HOSPITAL_COMMUNITY): Payer: Self-pay | Admitting: Obstetrics and Gynecology

## 2014-11-28 ENCOUNTER — Other Ambulatory Visit: Payer: Self-pay | Admitting: Obstetrics and Gynecology

## 2014-11-29 LAB — CYTOLOGY - PAP

## 2015-12-02 IMAGING — US US RENAL
1 series · 14 of 25 positions shown · non-contrast
Comparison: None.

CLINICAL DATA: Pregnancy.  Twins.

EXAM:
RENAL/URINARY TRACT ULTRASOUND COMPLETE

[Series 1: us renal · 14 of 47 slices shown]
[im 1/47]
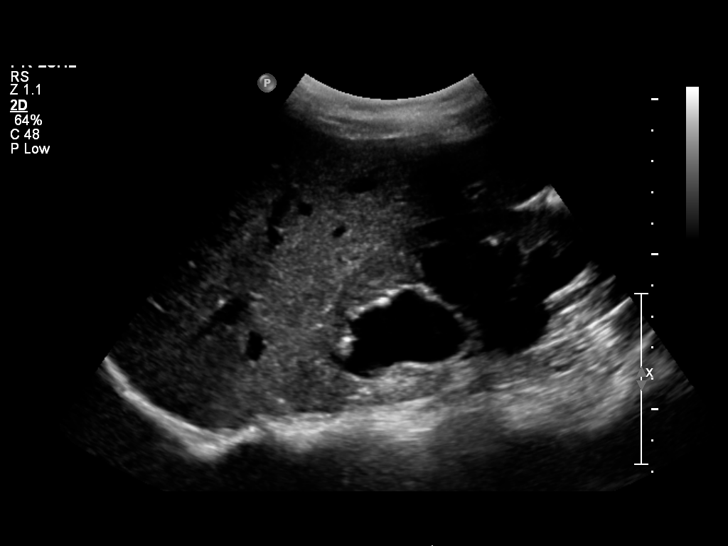
[im 4/47]
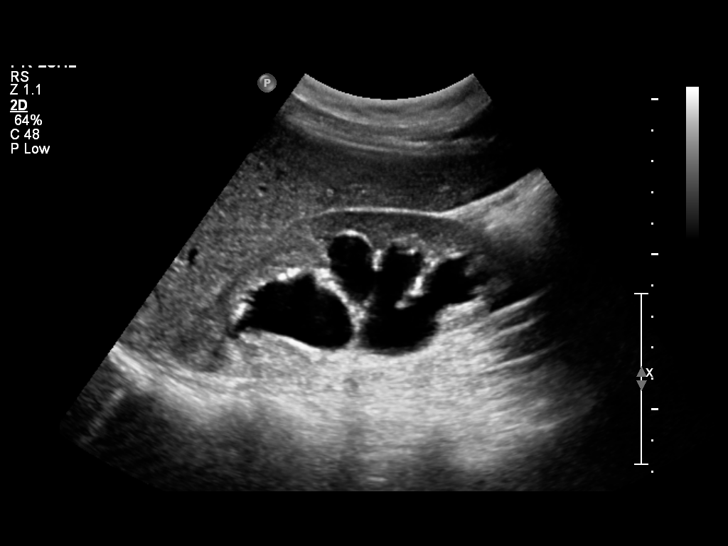
[im 8/47]
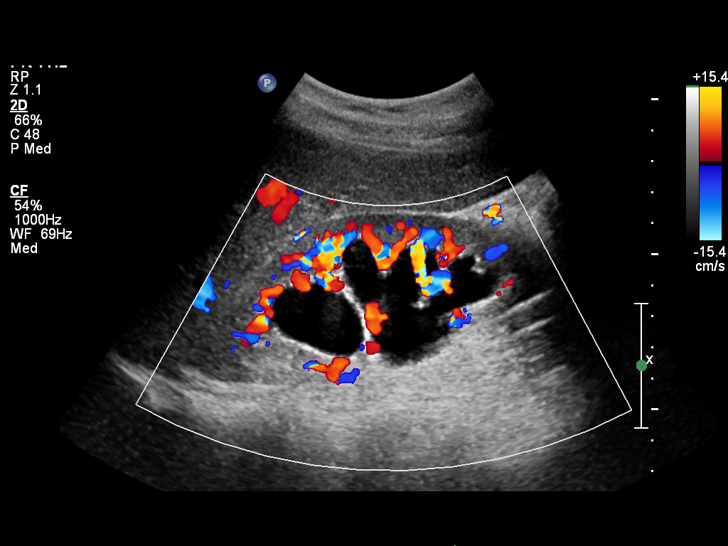
[im 12/47]
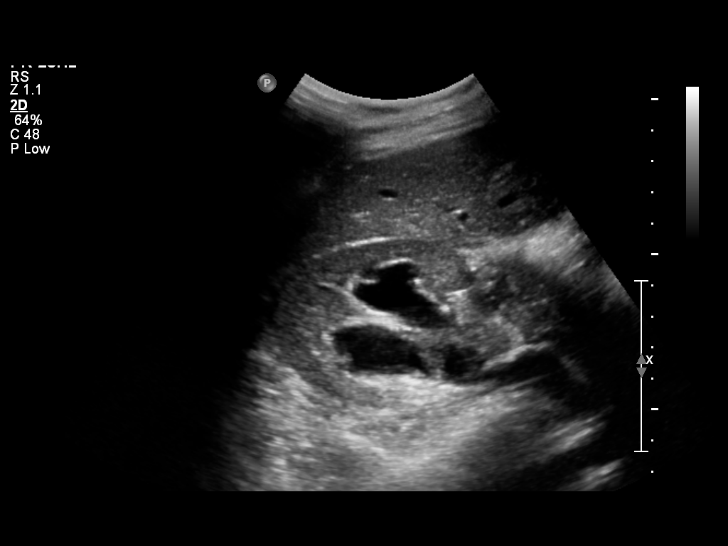
[im 16/47]
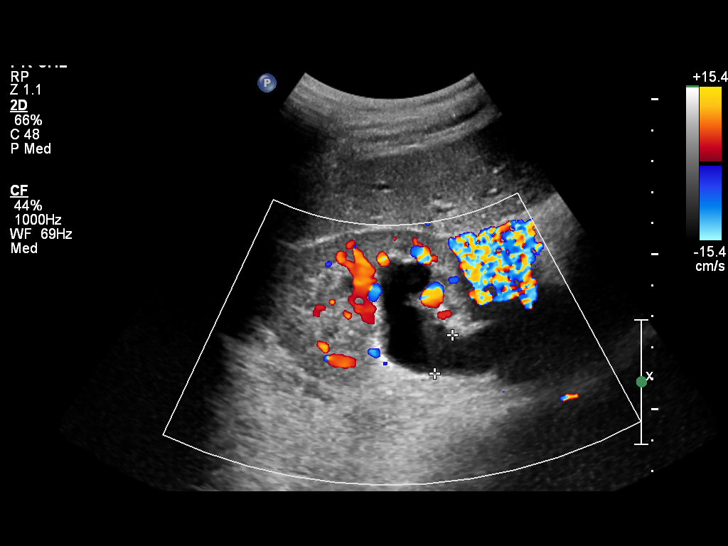
[im 18/47]
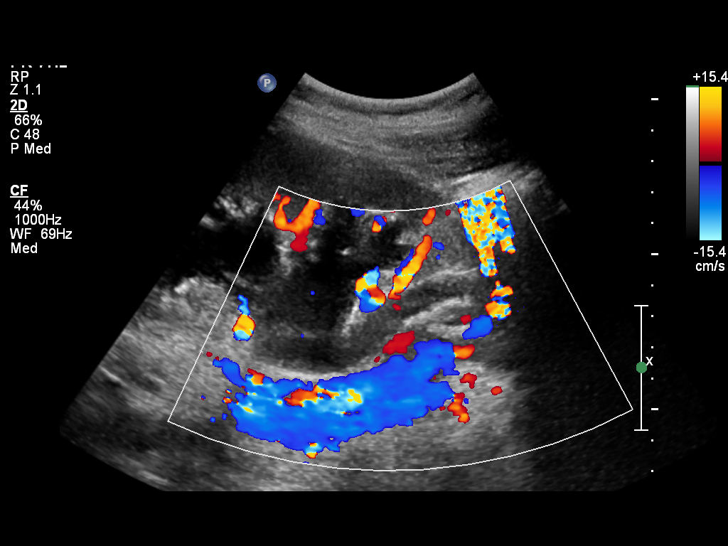
[im 22/47]
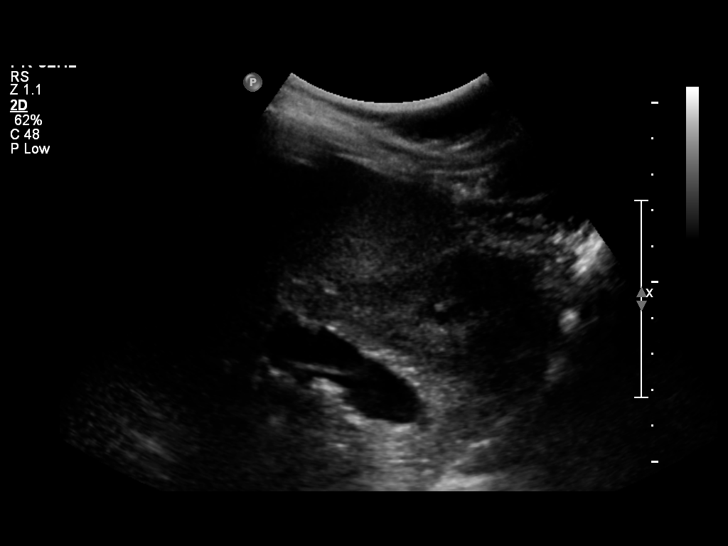
[im 25/47]
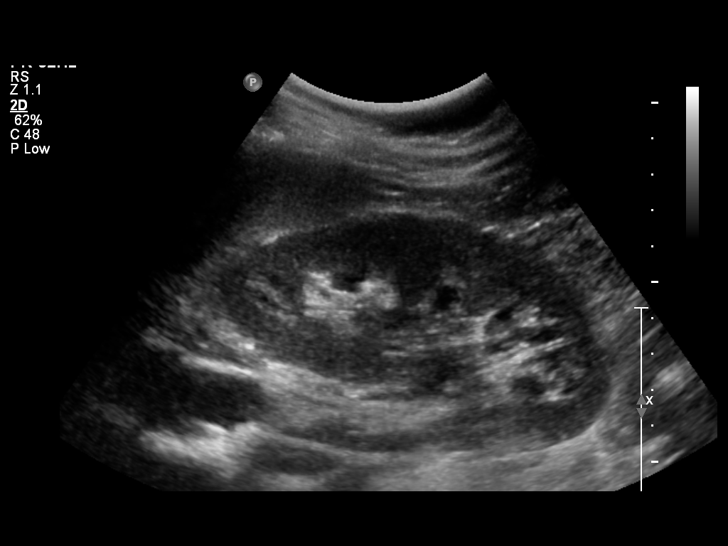
[im 29/47]
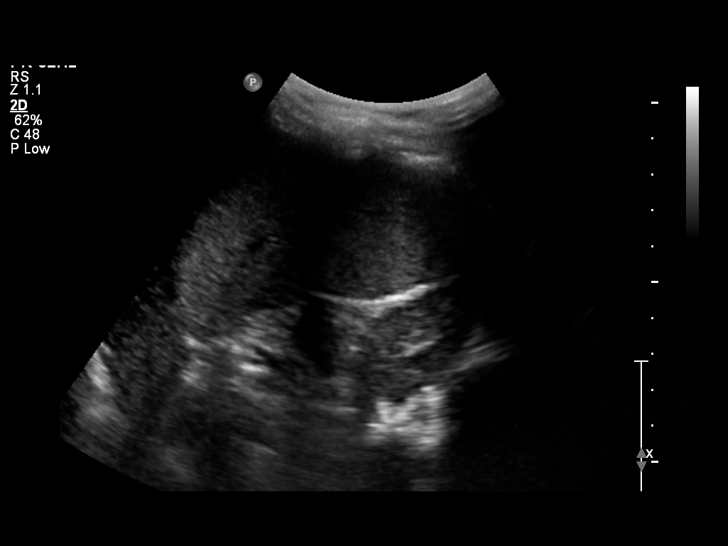
[im 31/47]
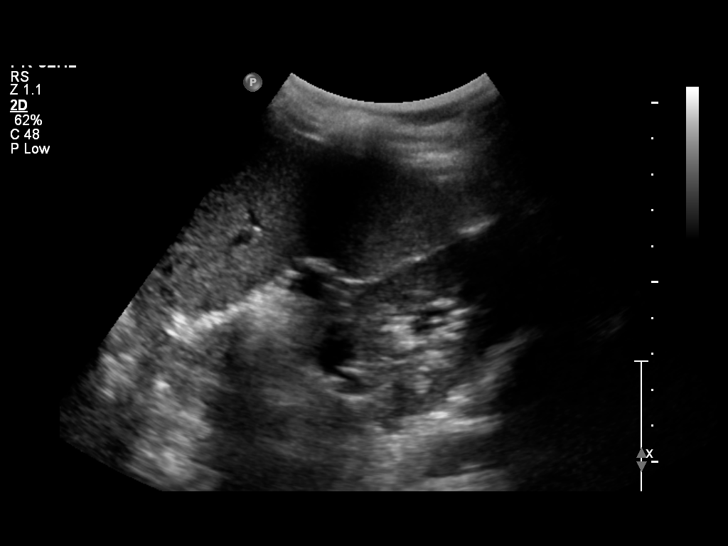
[im 35/47]
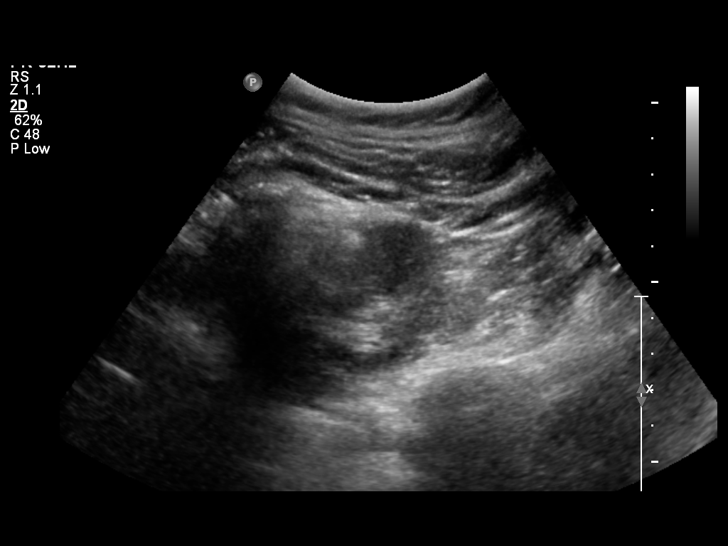
[im 39/47]
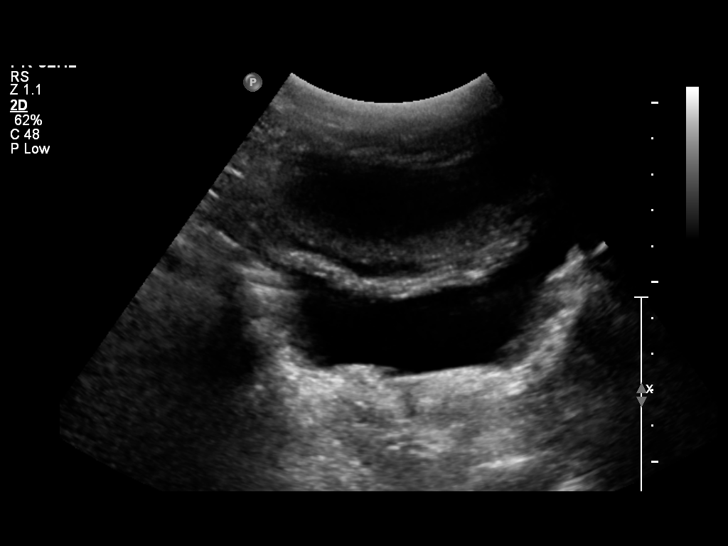
[im 43/47]
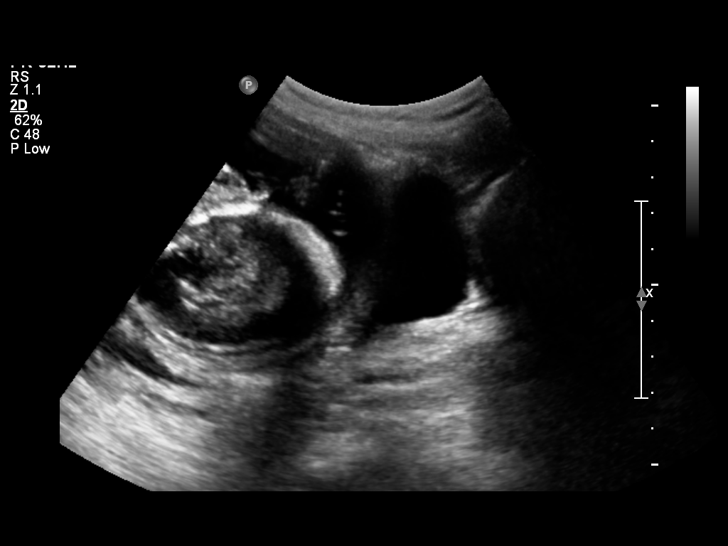
[im 47/47]
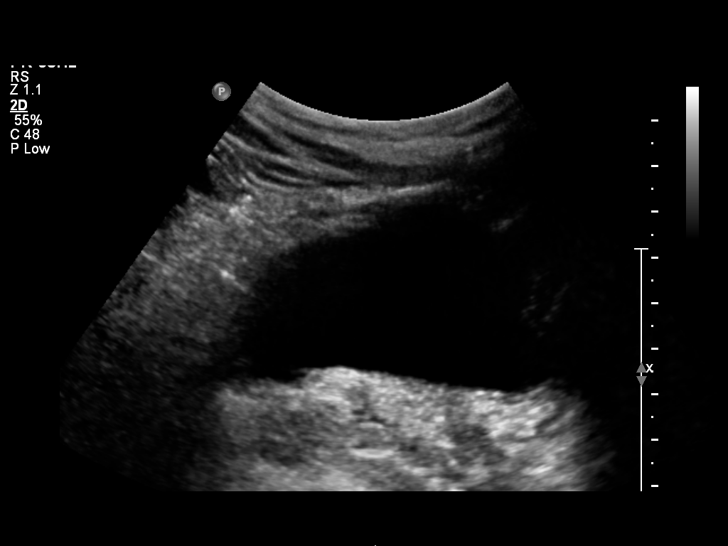

[14 of 25 positions shown; findings below may reference images not displayed]

FINDINGS: Right Kidney:

Length: 12.7 cm.. Moderate hydronephrosis on the right. Renal
echotexture on the right is normal.

Left Kidney:

Length: 11.0 cm. Echogenicity within normal limits. No mass or
hydronephrosis visualized.

Bladder:

Appears normal for degree of bladder distention. The patient is
pregnant.
IMPRESSION: Moderate right-sided hydronephrosis.

## 2016-03-06 IMAGING — US US OB COMP EACH ADDL GEST+14 WKS
1 series · 13 of 28 positions shown · non-contrast
Comparison: none

[Series 1: us ob comp each addl gest+14 wks · 0.12mm/px · 13 of 104 slices shown]
[im 4/104]
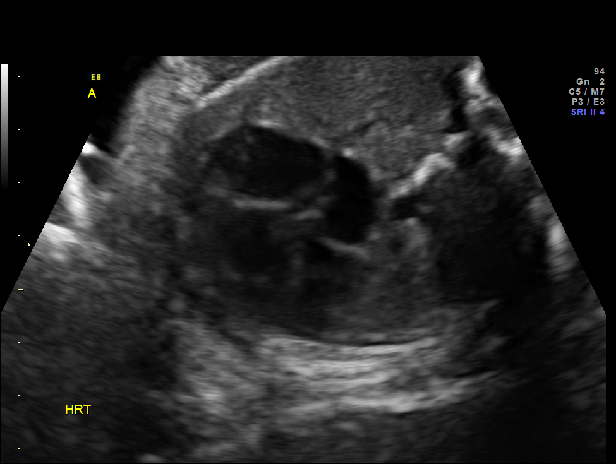
[im 12/104]
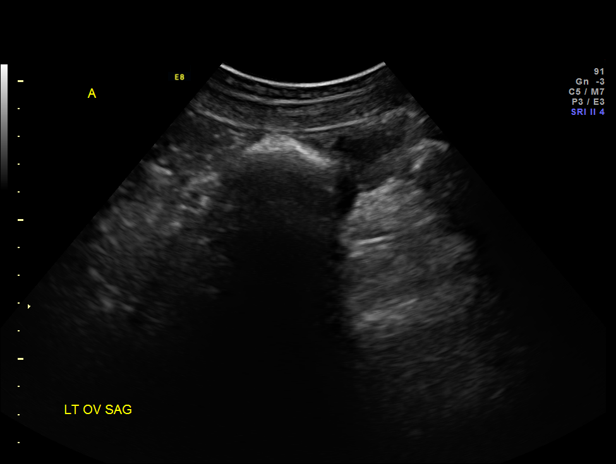
[im 20/104]
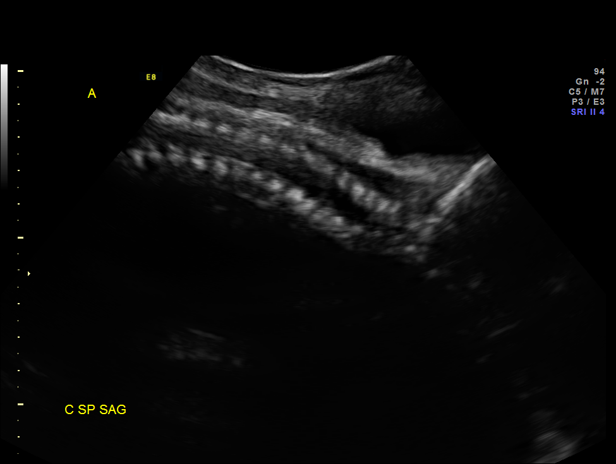
[im 27/104]
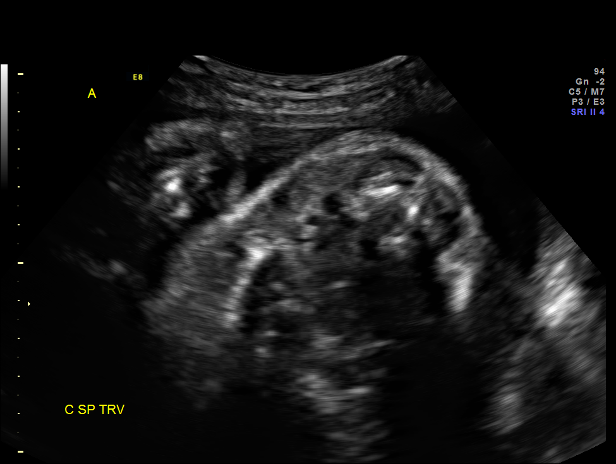
[im 35/104]
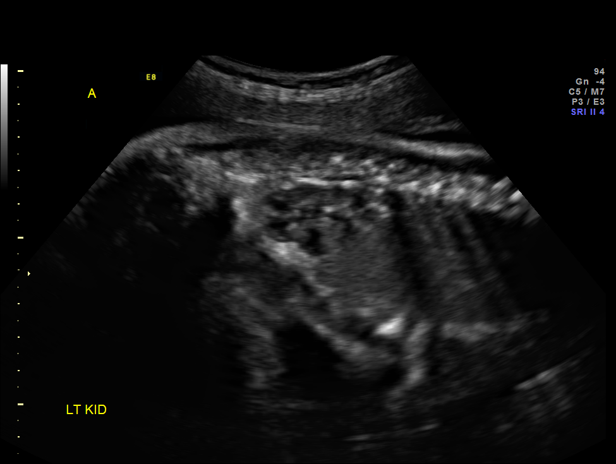
[im 42/104]
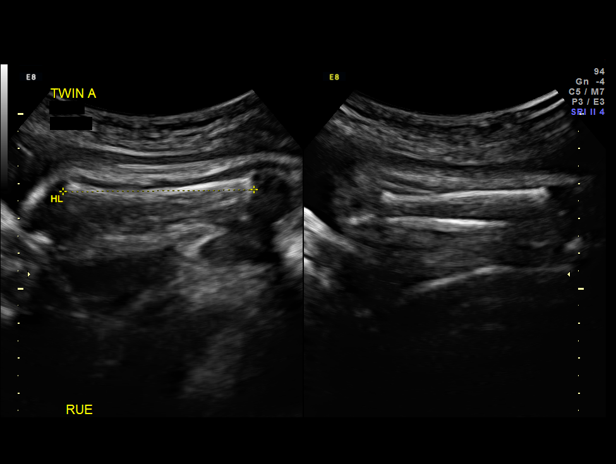
[im 54/104]
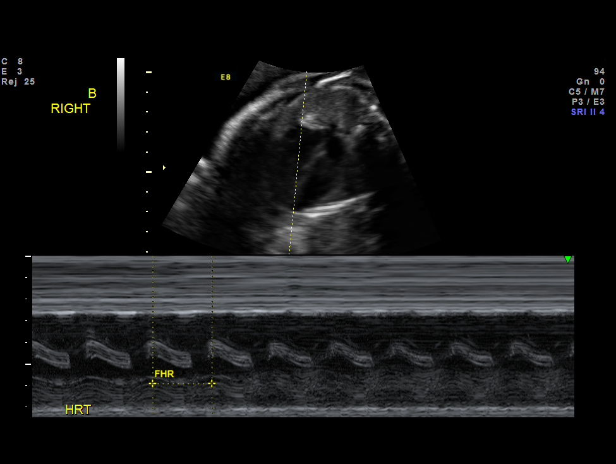
[im 62/104]
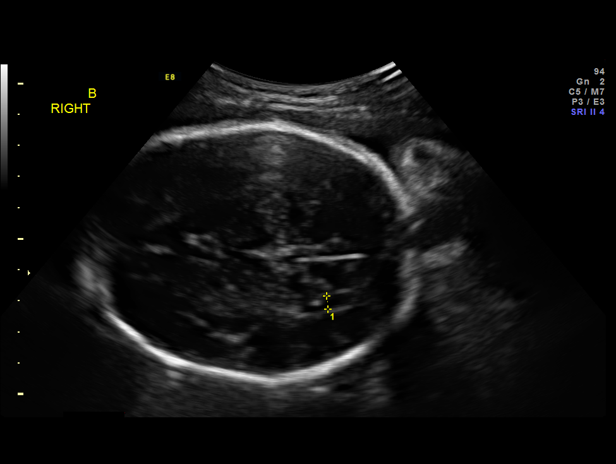
[im 69/104]
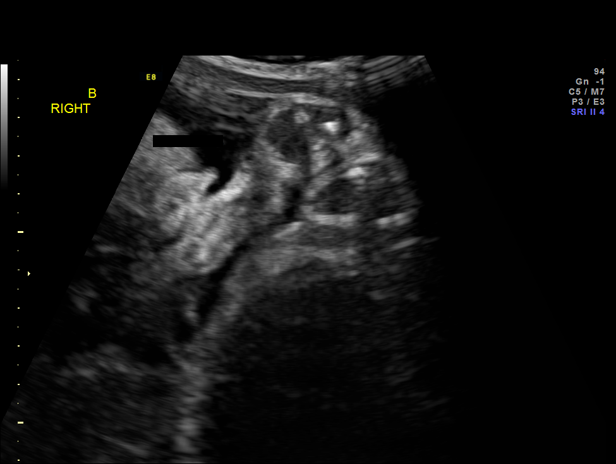
[im 77/104]
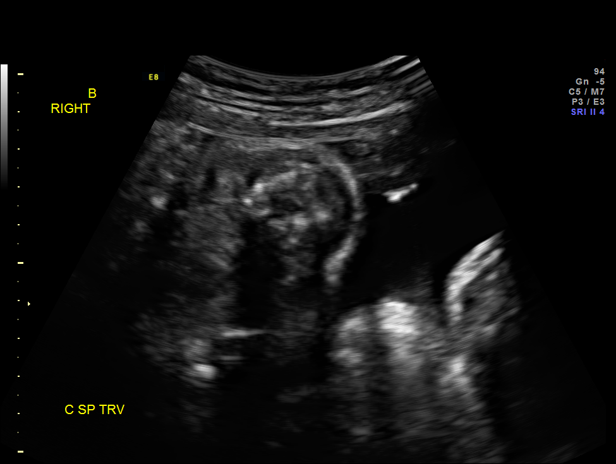
[im 84/104]
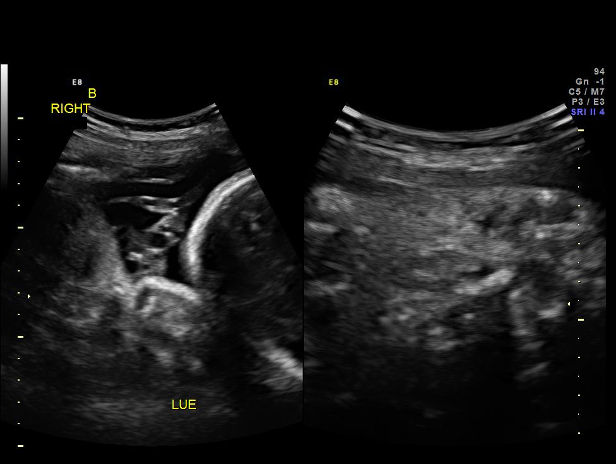
[im 92/104]
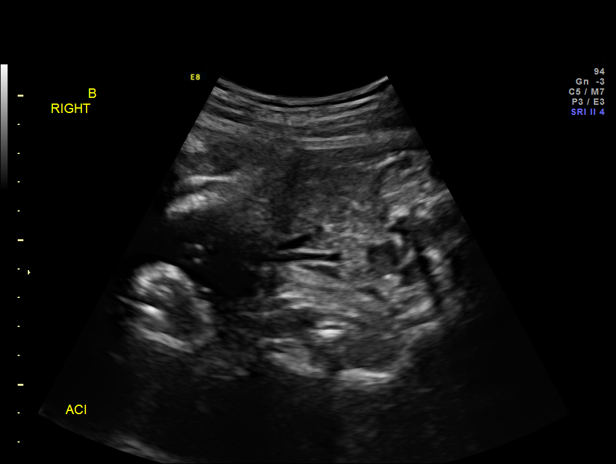
[im 100/104]
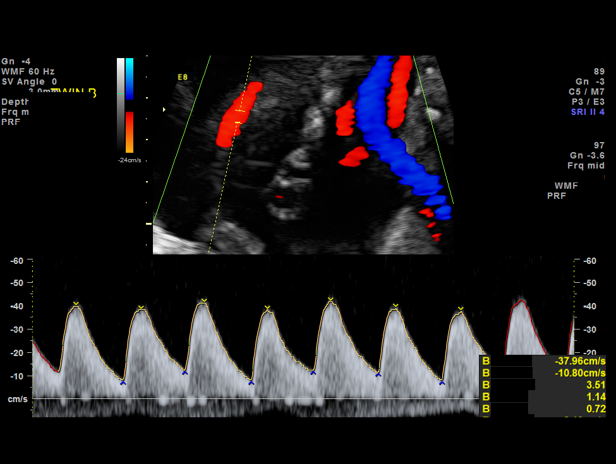

[13 of 28 positions shown; findings below may reference images not displayed]

OBSTETRICS REPORT
                      (Signed Final 09/15/2013 [DATE])

Service(s) Provided

 US OB COMP + 14 WK                                    76805.1
 US OB COMP ADDL GEST + 14 WK                          76810.1
 US UA CORD DOPPLER                                    76820.0
 US UA ADDL GEST                                       76820.1
Indications

 Twin gestation, Latya Kim
 Size less than dates (Small for gestational [AGE]
 FGR)
Fetal Evaluation (Fetus A)

 Num Of Fetuses:    2
 Fetal Heart Rate:  141                          bpm
 Cardiac Activity:  Observed
 Fetal Lie:         Lower left Fetus
 Presentation:      Cephalic
 Placenta:          Posterior, above cervical
                    os

 Membrane Desc:     Dividing
                    Membrane seen
                    - Monochorionic

 Amniotic Fluid
 AFI FV:      Subjectively within normal limits
                                             Larg Pckt:     4.3  cm
Biophysical Evaluation (Fetus A)

 Amniotic F.V:   Pocket => 2 cm two         F. Tone:        Observed
                 planes
 F. Movement:    Observed                   Score:          [DATE]
 F. Breathing:   Observed
Biometry (Fetus A)

 BPD:     89.6  mm     G. Age:  36w 2d                CI:         80.8   70 - 86
 OFD:    110.9  mm                                    FL/HC:      19.9   20.1 -

 HC:     319.8  mm     G. Age:  36w 0d       39  %    HC/AC:      1.20   0.93 -

 AC:     266.9  mm     G. Age:  30w 6d      < 3  %    FL/BPD:     70.9   71 - 87
 FL:      63.5  mm     G. Age:  32w 6d        4  %    FL/AC:      23.8   20 - 24
 HUM:     54.8  mm     G. Age:  31w 6d      < 5  %
 CER:     42.7  mm     G. Age:  36w 5d       64  %
 ULN:     53.8  mm     G. Age:  33w 6d       24  %
 TIB:     55.9  mm     G. Age:  32w 6d       23  %
 RAD:     48.3  mm     G. Age:  36w 2d       52  %
 FIB:     53.7  mm     G. Age:  32w 6d       44  %

 Est. FW:    0870  gm      4 lb 5 oz     13  %     FW Discordancy      0 \ 3 %
Gestational Age (Fetus A)

 LMP:           36w 3d        Date:  01/03/13                 EDD:   10/10/13
 U/S Today:     34w 0d                                        EDD:   10/27/13
 Best:          35w 1d     Det. By:  Early Ultrasound         EDD:   10/19/13
Anatomy (Fetus A)

 Cranium:          Appears normal         Diaphragm:        Appears normal
 Fetal Cavum:      Appears normal         Stomach:          Appears normal, left
                                                            sided
 Ventricles:       Appears normal         Abdomen:          Appears normal
 Choroid Plexus:   Appears normal         Abdominal Wall:   Appears nml (cord
                                                            insert, abd wall)
 Cerebellum:       Appears normal         Cord Vessels:     Appears normal (3
                                                            vessel cord)
 Posterior Fossa:  Appears normal         Kidneys:          Appear normal
 Nuchal Fold:      Not applicable (>20    Bladder:          Appears normal
                   wks GA)
 Face:             Not well visualized    Spine:            Appears normal
 Lips:             Not well visualized    Lower             Visualized
                                          Extremities:
 Heart:            Appears normal         Upper             Visualized
                   (4CH, axis, and        Extremities:
                   situs)

 Other:  Technically difficult due to advanced GA and fetal position.
Doppler - Fetal Vessels (Fetus A)

 Umbilical Artery
 S/D:   2.45           50  %tile       RI:
 PI:    0.85                           PSV:       55.58   cm/s
 Umbilical Artery
 Absent DFV:    No     Reverse DFV:    No

Fetal Evaluation (Fetus B)

 Num Of Fetuses:    2
 Fetal Heart Rate:  144                          bpm
 Cardiac Activity:  Observed
 Fetal Lie:         Upper right Fetus
 Presentation:      Cephalic
 Placenta:          Posterior, above cervical
                    os

 Membrane Desc:     Dividing
                    Membrane seen
                    - Monochorionic
 Amniotic Fluid
 AFI FV:      Subjectively within normal limits
                                             Larg Pckt:     4.2  cm
Biophysical Evaluation (Fetus B)

 Amniotic F.V:   Pocket => 2 cm two         F. Tone:        Observed
                 planes
 F. Movement:    Observed                   Score:          [DATE]
 F. Breathing:   Observed
Biometry (Fetus B)

 BPD:     81.5  mm     G. Age:  32w 5d                CI:         74.0   70 - 86
 OFD:    110.2  mm                                    FL/HC:      20.0   20.1 -

 HC:     308.1  mm     G. Age:  34w 3d        8  %    HC/AC:      1.12   0.93 -

 AC:     275.2  mm     G. Age:  31w 4d      < 3  %    FL/BPD:     75.7   71 - 87
 FL:      61.7  mm     G. Age:  32w 0d      < 3  %    FL/AC:      22.4   20 - 24
 HUM:     54.4  mm     G. Age:  31w 4d      < 5  %
 CER:     45.3  mm     G. Age:  N/A          87  %
 ULN:     53.4  mm     G. Age:  33w 4d       19  %
 TIB:     55.5  mm     G. Age:  32w 5d       19  %
 RAD:     47.2  mm     G. Age:  34w 1d       46  %
 FIB:     52.6  mm     G. Age:  32w 2d       28  %

 Est. FW:    0299  gm      4 lb 3 oz   < 10  %     FW Discordancy         3  %
Gestational Age (Fetus B)

 LMP:           36w 3d        Date:  01/03/13                 EDD:   10/10/13
 U/S Today:     32w 5d                                        EDD:   11/05/13
 Best:          35w 1d     Det. By:  Early Ultrasound         EDD:   10/19/13
Anatomy (Fetus B)

 Cranium:          Appears normal         Diaphragm:        Appears normal
 Fetal Cavum:      Appears normal         Stomach:          Appears normal, left
                                                            sided
 Ventricles:       Appears normal         Abdomen:          Appears normal
 Choroid Plexus:   Appears normal         Abdominal Wall:   Not well visualized
 Cerebellum:       Appears normal         Cord Vessels:     Appears normal (3
                                                            vessel cord)
 Posterior Fossa:  Appears normal         Kidneys:          Appear normal
 Nuchal Fold:      Not applicable (>20    Bladder:          Appears normal
                   wks GA)
 Face:             Orbits nl; profile not Spine:            Appears normal
                   well visualized
 Lips:             Appears normal         Lower             Visualized
                                          Extremities:
 Heart:            Appears normal         Upper             Visualized
                   (4CH, axis, and        Extremities:
                   situs)

 Other:  Technically difficult due to advanced GA and fetal position.
Targeted Anatomy (Fetus B)
 Fetal Central Nervous System
 Cisterna Magna:
Doppler - Fetal Vessels (Fetus B)

 Umbilical Artery
 S/D:   3.54           93  %tile       RI:
 PI:    1.17                           PSV:       42.89   cm/s
 Umbilical Artery
 Absent DFV:    No     Reverse DFV:    No

Cervix Uterus Adnexa

 Cervix:       Not visualized (advanced GA >97wks)

 Adnexa:     No abnormality visualized.
Comments

 Ms. Suvorova was referred to the [facility] due to concern for fetal
 growth restriction of twin B on an ultrasound performed in her
 primary OBs office today.  Our ultrasound confirms this
 finding and also suggests fetal growth restriction of twin A.
 Fortunately, the BPP and umbilical artery Dopples were
 normal for both twin.  Thus, while I recommend delivery at
 this time it is not neccesary to proceed immediately with
 induction of labor or cesarean delivery.  Ms. Suvorova is in
 agreement with delivery at this time, but would like to return
 home to get some personal items to bring with her to the
 hospital.  She strongly desires a vaginal birth, and I think she
 is an excellent candidate having had two prior term vaginal
 deliveries and cephalic/cephalic twins with reassuring BPPs.
 Ms. Suvorova had several concerns about the risks to her
 babies with a delivery at 35 weeks.  I discussing them in
 general terms; however, I think she would benefit from a
 NICU consultation as she wanted more specific information
 that I was able to provide.
Impression

 Monochorionic diamniotic twin intrauterine pregnancy at 35
 weeks 1 day.
 Fetal growth restriction of twin A (EFW 13%, AC<3%).
 Fetal growth restriction of twin B (EFW<10%).
 Normal amniotic fluid volume of both twins.
 No gross fetal anomalies identified in either twin.
Recommendations

 See comments.

 (Signed for Dr. Gargi)

 questions or concerns.
